# Patient Record
Sex: Female | Born: 1965 | Race: White | Hispanic: No | Marital: Married | Smoking: Never smoker
Health system: Southern US, Community
[De-identification: ages and names within clinical notes are randomized; demographics above are authoritative.]

## PROBLEM LIST (undated history)

## (undated) DIAGNOSIS — C801 Malignant (primary) neoplasm, unspecified: Secondary | ICD-10-CM

## (undated) DIAGNOSIS — Z923 Personal history of irradiation: Secondary | ICD-10-CM

## (undated) DIAGNOSIS — Z803 Family history of malignant neoplasm of breast: Secondary | ICD-10-CM

## (undated) HISTORY — DX: Family history of malignant neoplasm of breast: Z80.3

---

## 2017-06-14 ENCOUNTER — Other Ambulatory Visit: Payer: Self-pay

## 2017-06-14 DIAGNOSIS — R5381 Other malaise: Secondary | ICD-10-CM

## 2017-06-17 ENCOUNTER — Other Ambulatory Visit: Payer: Self-pay | Admitting: Nurse Practitioner

## 2017-06-17 DIAGNOSIS — R921 Mammographic calcification found on diagnostic imaging of breast: Secondary | ICD-10-CM

## 2017-06-29 ENCOUNTER — Other Ambulatory Visit: Payer: Self-pay | Admitting: Nurse Practitioner

## 2017-06-29 ENCOUNTER — Encounter (INDEPENDENT_AMBULATORY_CARE_PROVIDER_SITE_OTHER): Payer: Self-pay

## 2017-06-29 ENCOUNTER — Ambulatory Visit
Admission: RE | Admit: 2017-06-29 | Discharge: 2017-06-29 | Disposition: A | Discharge status: Home / Self Care | Payer: 59 | Source: Ambulatory Visit | Attending: Nurse Practitioner | Admitting: Nurse Practitioner

## 2017-06-29 DIAGNOSIS — R921 Mammographic calcification found on diagnostic imaging of breast: Secondary | ICD-10-CM

## 2018-01-07 ENCOUNTER — Ambulatory Visit
Admission: RE | Admit: 2018-01-07 | Discharge: 2018-01-07 | Disposition: A | Discharge status: Home / Self Care | Payer: BLUE CROSS/BLUE SHIELD | Source: Ambulatory Visit | Attending: Nurse Practitioner | Admitting: Nurse Practitioner

## 2018-01-07 DIAGNOSIS — R921 Mammographic calcification found on diagnostic imaging of breast: Secondary | ICD-10-CM

## 2018-11-30 ENCOUNTER — Other Ambulatory Visit: Payer: Self-pay | Admitting: Nurse Practitioner

## 2018-11-30 DIAGNOSIS — R921 Mammographic calcification found on diagnostic imaging of breast: Secondary | ICD-10-CM

## 2019-01-03 HISTORY — PX: BREAST BIOPSY: SHX20

## 2019-01-17 ENCOUNTER — Other Ambulatory Visit: Payer: Self-pay | Admitting: Nurse Practitioner

## 2019-01-17 ENCOUNTER — Other Ambulatory Visit: Payer: Self-pay

## 2019-01-17 ENCOUNTER — Ambulatory Visit
Admission: RE | Admit: 2019-01-17 | Discharge: 2019-01-17 | Disposition: A | Discharge status: Home / Self Care | Payer: 59 | Source: Ambulatory Visit | Attending: Nurse Practitioner | Admitting: Nurse Practitioner

## 2019-01-17 ENCOUNTER — Ambulatory Visit
Admission: RE | Admit: 2019-01-17 | Discharge: 2019-01-17 | Disposition: A | Discharge status: Home / Self Care | Payer: BC Managed Care – PPO | Source: Ambulatory Visit | Attending: Nurse Practitioner | Admitting: Nurse Practitioner

## 2019-01-17 DIAGNOSIS — N6310 Unspecified lump in the right breast, unspecified quadrant: Secondary | ICD-10-CM

## 2019-01-17 DIAGNOSIS — R921 Mammographic calcification found on diagnostic imaging of breast: Secondary | ICD-10-CM

## 2019-01-23 ENCOUNTER — Ambulatory Visit
Admission: RE | Admit: 2019-01-23 | Discharge: 2019-01-23 | Disposition: A | Discharge status: Home / Self Care | Payer: BC Managed Care – PPO | Source: Ambulatory Visit | Attending: Nurse Practitioner | Admitting: Nurse Practitioner

## 2019-01-23 ENCOUNTER — Other Ambulatory Visit: Payer: Self-pay

## 2019-01-23 DIAGNOSIS — R921 Mammographic calcification found on diagnostic imaging of breast: Secondary | ICD-10-CM

## 2019-01-23 DIAGNOSIS — N6310 Unspecified lump in the right breast, unspecified quadrant: Secondary | ICD-10-CM

## 2019-01-25 ENCOUNTER — Encounter: Payer: Self-pay | Admitting: *Deleted

## 2019-01-25 DIAGNOSIS — D0511 Intraductal carcinoma in situ of right breast: Secondary | ICD-10-CM | POA: Insufficient documentation

## 2019-01-30 NOTE — Progress Notes (Signed)
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 Ductal carcinoma in situ (DCIS) of right breast  D05.11     HISTORY OF PRESENT ILLNESS::Kari Roberts is a 53 y.o. female who is presenting to the office today for evaluation of her newly diagnosed breast cancer. She is accompanied by her husband. She is doing well overall.   Patient has a known history of right breast calcifications that were being followed since 12/29/2016. She underwent routine bilateral diagnostic mammography with tomography and right breast ultrasonography on 01/17/2019 at [facility], which showed two groups of changing right breast calcifications and a new right breast mass at 9 o'clock. No suspicious findings on the left.   Biopsy on 01/23/2019 showed intermediate to high grade ductal carcinoma in situ with calcifications and focal necrosis of both the lateral anterior and posterior aspects of the right breast. Right axillary lymph node was negative. Prognostic indicators significant for: estrogen receptor, 100% positive and progesterone receptor, 95% positive, both with strong staining intensity.  Menarche: 53 years old Age at first live birth: N/A GP: 0 LMP: 01/19/2019 Contraceptive: Yes, for 31 years HRT: No   The patient was referred today for presentation in the multidisciplinary conference.  Radiology studies and pathology slides were presented there for review and discussion of treatment options.  A consensus was discussed regarding potential next  steps.  PREVIOUS RADIATION THERAPY: No  PAST MEDICAL HISTORY: No past medical history on file.  PAST SURGICAL HISTORY:No past surgical history on file.  FAMILY HISTORY:  Family History  Problem Relation Age of Onset  . Lung cancer Father   . Breast cancer Sister     SOCIAL HISTORY:  Social History   Socioeconomic History  . Marital status: Unknown    Spouse name: Not on file  . Number of children: Not on file  . Years of education: Not on file  . Highest education level: Not on file  Occupational History  . Not on file  Tobacco Use  . Smoking status: Never Smoker  . Smokeless tobacco: Never Used  Substance and Sexual Activity  . Alcohol use: Not Currently  . Drug use: Never  . Sexual activity: Not on file  Other Topics Concern  . Not on file  Social History Narrative  . Not on file   Social Determinants of Health   Financial Resource Strain:   . Difficulty of Paying Living Expenses: Not on file  Food Insecurity:   . Worried About Charity fundraiser in the Last Year: Not on file  . Ran Out of Food in the Last Year: Not on file  Transportation Needs:   . Lack of Transportation (Medical): Not on file  . Lack of Transportation (Non-Medical): Not on file  Physical Activity:   . Days of Exercise per Week: Not on file  . Minutes of Exercise per Session: Not on file  Stress:   . Feeling of Stress : Not on file  Social Connections:   . Frequency of Communication with Friends and Family: Not on  file  . Frequency of Social Gatherings with Friends and Family: Not on file  . Attends Religious Services: Not on file  . Active Member of Clubs or Organizations: Not on file  . Attends Archivist Meetings: Not on file  . Marital Status: Not on file    ALLERGIES:  Allergies  Allergen Reactions  . Augmentin [Amoxicillin-Pot Clavulanate] Hives    MEDICATIONS:  No current outpatient medications on file.   No current facility-administered medications for this  encounter.    REVIEW OF SYSTEMS: A 10+ POINT REVIEW OF SYSTEMS WAS OBTAINED including neurology, dermatology, psychiatry, cardiac, respiratory, lymph, extremities, GI, GU, musculoskeletal, constitutional, reproductive, HEENT. On the provided form, she reports tinnitus and right breast pain. She reports wearing glasses. She denies fever, chills, shortness of breath, cough, abdominal pain, rash, and any other symptoms.    PHYSICAL EXAM:  Vitals with BMI 02/01/2019  Height '5\' 3"'$   Weight 137 lbs 10 oz  BMI 85.02  Systolic 774  Diastolic 86  Pulse 128    Lungs are clear to auscultation bilaterally. Heart has regular rate and rhythm. No palpable cervical, supraclavicular, or axillary adenopathy. Abdomen soft, non-tender, normal bowel sounds. Left breast with no palpable mass, nipple discharge, or bleeding. Right breast has two steri-strips in place on the lateral aspect of the breast with bruising. No palpable mass, nipple discharge, or bleeding.  KPS = 100  100 - Normal; no complaints; no evidence of disease. 90   - Able to carry on normal activity; minor signs or symptoms of disease. 80   - Normal activity with effort; some signs or symptoms of disease. 62   - Cares for self; unable to carry on normal activity or to do active work. 60   - Requires occasional assistance, but is able to care for most of his personal needs. 50   - Requires considerable assistance and frequent medical care. 74   - Disabled; requires special care and assistance. 15   - Severely disabled; hospital admission is indicated although death not imminent. 63   - Very sick; hospital admission necessary; active supportive treatment necessary. 10   - Moribund; fatal processes progressing rapidly. 0     - Dead  Karnofsky DA, Abelmann Guin, Craver LS and Burchenal [facility] [phone]) The use of the nitrogen mustards in the palliative treatment of carcinoma: with particular reference to bronchogenic carcinoma Cancer 1  [id number]  LABORATORY DATA:  Lab Results  Component Value Date   WBC 6.0 02/01/2019   HGB 14.5 02/01/2019   HCT 43.7 02/01/2019   MCV 87.2 02/01/2019   PLT 256 02/01/2019   Lab Results  Component Value Date   NA 140 02/01/2019   K 4.1 02/01/2019   CL 104 02/01/2019   CO2 24 02/01/2019   Lab Results  Component Value Date   ALT 20 02/01/2019   AST 19 02/01/2019   ALKPHOS 96 02/01/2019   BILITOT 0.3 02/01/2019    PULMONARY FUNCTION TEST:   Recent Review Flowsheet Data    There is no flowsheet data to display.      RADIOGRAPHY: US BREAST LTD UNI RIGHT INC AXILLA  Result Date: 01/17/2019 CLINICAL DATA:  Follow-up of right breast calcifications. EXAM: DIGITAL DIAGNOSTIC BILATERAL MAMMOGRAM WITH CAD AND TOMO ULTRASOUND RIGHT BREAST COMPARISON:  Previous exam(s). ACR Breast Density Category c: The breast tissue is heterogeneously dense, which may obscure small masses. FINDINGS: The 2 groups of right breast calcifications demonstrate interval change. The more anterior group demonstrates  linear components based on the CC view and span up to 6 mm. Just superior and posterior to this croup is another group of calcifications which are pleomorphic based on the 90 degree lateral view. This more posterior group spans 2.7 mm. Finally, there is a mass in the right breast located laterally measuring 6 mm. This mass persists on additional imaging. No other suspicious findings. Mammographic images were processed with CAD. On physical exam, no suspicious lumps are identified. Targeted ultrasound is performed, showing a hypoechoic mass in the right breast at 9 o'clock, 4 cm from the nipple measuring 4 x 2 x 7 mm, likely correlating with the mammographic finding. No axillary adenopathy. IMPRESSION: Two groups of changing right breast calcifications. New right breast mass at 9 o'clock. No suspicious findings on the left. RECOMMENDATION: Recommend stereotactic biopsy of both groups of calcifications. Recommend  ultrasound-guided biopsy of the right breast mass at 9 o'clock. I have discussed the findings and recommendations with the patient. If applicable, a reminder letter will be sent to the patient regarding the next appointment. BI-RADS CATEGORY  4: Suspicious. Electronically Signed   By: Dorise Bullion III M.D   On: 01/17/2019 12:14   MM DIAG BREAST TOMO BILATERAL  Result Date: 01/17/2019 CLINICAL DATA:  Follow-up of right breast calcifications. EXAM: DIGITAL DIAGNOSTIC BILATERAL MAMMOGRAM WITH CAD AND TOMO ULTRASOUND RIGHT BREAST COMPARISON:  Previous exam(s). ACR Breast Density Category c: The breast tissue is heterogeneously dense, which may obscure small masses. FINDINGS: The 2 groups of right breast calcifications demonstrate interval change. The more anterior group demonstrates linear components based on the CC view and span up to 6 mm. Just superior and posterior to this croup is another group of calcifications which are pleomorphic based on the 90 degree lateral view. This more posterior group spans 2.7 mm. Finally, there is a mass in the right breast located laterally measuring 6 mm. This mass persists on additional imaging. No other suspicious findings. Mammographic images were processed with CAD. On physical exam, no suspicious lumps are identified. Targeted ultrasound is performed, showing a hypoechoic mass in the right breast at 9 o'clock, 4 cm from the nipple measuring 4 x 2 x 7 mm, likely correlating with the mammographic finding. No axillary adenopathy. IMPRESSION: Two groups of changing right breast calcifications. New right breast mass at 9 o'clock. No suspicious findings on the left. RECOMMENDATION: Recommend stereotactic biopsy of both groups of calcifications. Recommend ultrasound-guided biopsy of the right breast mass at 9 o'clock. I have discussed the findings and recommendations with the patient. If applicable, a reminder letter will be sent to the patient regarding the next appointment.  BI-RADS CATEGORY  4: Suspicious. Electronically Signed   By: Dorise Bullion III M.D   On: 01/17/2019 12:14   MM CLIP PLACEMENT RIGHT  Result Date: 01/23/2019 CLINICAL DATA:  Patient status post stereotactic guided core needle biopsy right breast calcifications, 2 sites. Patient status post ultrasound-guided biopsy right breast mass. EXAM: DIAGNOSTIC RIGHT MAMMOGRAM POST ULTRASOUND AND STEREOTACTIC BIOPSY COMPARISON:  Previous exam(s). FINDINGS: Patient status post 3 biopsies of the right breast. Site 1: Right breast calcifications lateral anterior: Coil shaped: In appropriate position. Site 2: Right breast calcifications lateral posterior: X shaped clip: In appropriate position. Site 3: Right breast mass 9 o'clock position: Ribbon shaped clip: In appropriate position. IMPRESSION: Appropriate position of the biopsy marking clips after 3 site biopsy as described above. Final Assessment: Post Procedure Mammograms for Marker Placement Electronically Signed   By: Lovey Newcomer  M.D.   On: 01/23/2019 12:34   MM RT BREAST BX W LOC DEV 1ST LESION IMAGE BX SPEC STEREO GUIDE  Result Date: 01/23/2019 CLINICAL DATA:  Patient with indeterminate right breast calcifications lateral right breast, 2 sites. EXAM: RIGHT BREAST STEREOTACTIC CORE NEEDLE BIOPSY COMPARISON:  Previous exams. FINDINGS: The patient and I discussed the procedure of stereotactic-guided biopsy including benefits and alternatives. We discussed the high likelihood of a successful procedure. We discussed the risks of the procedure including infection, bleeding, tissue injury, clip migration, and inadequate sampling. Informed written consent was given. The usual time out protocol was performed immediately prior to the procedure. Site 1: Lateral right breast anterior: Coil shaped clip. Using sterile technique and 1% Lidocaine as local anesthetic, under stereotactic guidance, a 9 gauge vacuum assisted device was used to perform core needle biopsy of  calcifications within the outer right breast using a lateral approach. Specimen radiograph was performed showing calcifications. Specimens with calcifications are identified for pathology. Lesion quadrant: Upper outer quadrant At the conclusion of the procedure, coil shaped tissue marker clip was deployed into the biopsy cavity. Follow-up 2-view mammogram was performed and dictated separately. Site 2: Lateral right breast posterior: X shaped clip Using sterile technique and 1% Lidocaine as local anesthetic, under stereotactic guidance, a 9 gauge vacuum assisted device was used to perform core needle biopsy of calcifications within the outer right breast using a lateral approach. Specimen radiograph was performed showing calcifications. Specimens with calcifications are identified for pathology. Lesion quadrant: Upper outer quadrant At the conclusion of the procedure, X shaped tissue marker clip was deployed into the biopsy cavity. Follow-up 2-view mammogram was performed and dictated separately. IMPRESSION: Stereotactic-guided biopsy of right breast calcifications, 2 sites as above. No apparent complications. Electronically Signed   By: Lovey Newcomer M.D.   On: 01/23/2019 12:30   MM RT BREAST BX W LOC DEV EA AD LESION IMG BX SPEC STEREO GUIDE  Result Date: 01/23/2019 CLINICAL DATA:  Patient with indeterminate right breast calcifications lateral right breast, 2 sites. EXAM: RIGHT BREAST STEREOTACTIC CORE NEEDLE BIOPSY COMPARISON:  Previous exams. FINDINGS: The patient and I discussed the procedure of stereotactic-guided biopsy including benefits and alternatives. We discussed the high likelihood of a successful procedure. We discussed the risks of the procedure including infection, bleeding, tissue injury, clip migration, and inadequate sampling. Informed written consent was given. The usual time out protocol was performed immediately prior to the procedure. Site 1: Lateral right breast anterior: Coil shaped clip.  Using sterile technique and 1% Lidocaine as local anesthetic, under stereotactic guidance, a 9 gauge vacuum assisted device was used to perform core needle biopsy of calcifications within the outer right breast using a lateral approach. Specimen radiograph was performed showing calcifications. Specimens with calcifications are identified for pathology. Lesion quadrant: Upper outer quadrant At the conclusion of the procedure, coil shaped tissue marker clip was deployed into the biopsy cavity. Follow-up 2-view mammogram was performed and dictated separately. Site 2: Lateral right breast posterior: X shaped clip Using sterile technique and 1% Lidocaine as local anesthetic, under stereotactic guidance, a 9 gauge vacuum assisted device was used to perform core needle biopsy of calcifications within the outer right breast using a lateral approach. Specimen radiograph was performed showing calcifications. Specimens with calcifications are identified for pathology. Lesion quadrant: Upper outer quadrant At the conclusion of the procedure, X shaped tissue marker clip was deployed into the biopsy cavity. Follow-up 2-view mammogram was performed and dictated separately. IMPRESSION: Stereotactic-guided biopsy of right  breast calcifications, 2 sites as above. No apparent complications. Electronically Signed   By: Lovey Newcomer M.D.   On: 01/23/2019 12:30   [place] RT BREAST BX W LOC DEV 1ST LESION IMG BX SPEC US GUIDE  Addendum Date: 01/25/2019   ADDENDUM REPORT: 01/25/2019 15:47 ADDENDUM: Pathology revealed INTERMEDIATE TO HIGH GRADE DUCTAL CARCINOMA IN SITU WITH CALCIFICATIONS AND FOCAL NECROSIS of the RIGHT breast calcifications, lateral anterior. This was found to be concordant by Dr. Lovey Newcomer. Pathology revealed INTERMEDIATE TO HIGH GRADE DUCTAL CARCINOMA IN SITU WITH CALCIFICATIONS AND FOCAL NECROSIS of the RIGHT breast calcifications, lateral posterior. This was found to be concordant by Dr. Lovey Newcomer. Pathology revealed  BENIGN LYMPH NODE of the RIGHT breast, 9 o'clock. This was found to be concordant by Dr. Lovey Newcomer. Pathology results were discussed with the patient by telephone with Provider at [place]. The patient reported doing well after the biopsies with tenderness at the sites. Post biopsy instructions and care were reviewed and questions were answered. The patient was encouraged to call [facility] for any additional concerns. At the request of the patient, she was referred to [facility] on February 01, 2019. Pathology results reported by Stacie Acres, RN on 01/25/2019. Electronically Signed   By: Lovey Newcomer M.D.   On: 01/25/2019 15:47   Result Date: 01/25/2019 CLINICAL DATA:  Patient with indeterminate right breast mass 9 o'clock position. EXAM: ULTRASOUND GUIDED RIGHT BREAST CORE NEEDLE BIOPSY COMPARISON:  Previous exam(s). FINDINGS: I met with the patient and we discussed the procedure of ultrasound-guided biopsy, including benefits and alternatives. We discussed the high likelihood of a successful procedure. We discussed the risks of the procedure, including infection, bleeding, tissue injury, clip migration, and inadequate sampling. Informed written consent was given. The usual time-out protocol was performed immediately prior to the procedure. Lesion quadrant: Upper outer quadrant Using sterile technique and 1% Lidocaine as local anesthetic, under direct ultrasound visualization, a 14 gauge spring-loaded device was used to perform biopsy of right breast mass 9 o'clock position using a lateral approach. At the conclusion of the procedure ribbon shaped tissue marker clip was deployed into the biopsy cavity. Follow up 2 view mammogram was performed and dictated separately. IMPRESSION: Ultrasound guided biopsy of right breast mass 9 o'clock position. No apparent complications. Electronically Signed:  By: Lovey Newcomer M.D. On: 01/23/2019 12:25      IMPRESSION: Stage 0, Right Breast UOQ, Ductal Carcinoma DCIS, ER+ / PR+, Grade 2-3   stage 0 right breast cancer.  patient is an excellent candidate for breast conservation with radiation therapy directed at the right breast.  With her young age I would not recommend lumpectomy alone in this situation. she also appears to be a good candidate for hypofractionated accelerated radiation therapy over approximately four weeks. We discussed the general course of radiation, potential side effects, and toxicities with radiation and the patient is interested in this approach. We discussed potential radiation therapy at a facility closer to her home in [place], [place]. However, she states that she is more comfortable with treatment here.   PLAN:  1. Lumpectomy 2. Adjuvant radiation therapy 3. Aromatase inhibitor   ------------------------------------------------  Blair Promise, PhD, MD  This document serves as a record of services personally performed by Gery Pray, MD. It was created on his behalf by Clerance Lav, a trained medical scribe. The creation of this record is based on  the scribe's personal observations and the provider's statements to them. This document has been checked and approved by the attending provider.

## 2019-01-31 NOTE — Progress Notes (Signed)
[place] NOTE  Patient Care Team: Renne Crigler, NP as PCP - General (Nurse Practitioner) Mauro Kaufmann, RN as Oncology Nurse Navigator Rockwell Germany, RN as Oncology Nurse Navigator Rolm Bookbinder, MD as Consulting Physician (General Surgery) Nicholas Lose, MD as Consulting Physician (Hematology and Oncology) Gery Pray, MD as Consulting Physician (Radiation Oncology)  CHIEF COMPLAINTS/PURPOSE OF CONSULTATION:  Newly diagnosed breast cancer  HISTORY OF PRESENTING ILLNESS:  Kari Roberts 53 y.o. female is here because of recent diagnosis of ductal carcinoma in situ of the right breast. Diagnostic mammogram on 01/17/19 performed as follow-up of probably benign right breast calcifications showed two groups of right breast calcifications measuring 0.6cm and 0.3cm and a right breast mass measuring 0.7cm, with no axillary adenopathy. Biopsy on 01/23/19 showed DCIS with calcifications and focal necrosis at the lateral anterior position and lateral posterior position, high grade, ER+ 100%, PR+ 95%. And a benign lymph node representing the mass at the 9 o'clock position. She presents to the clinic today for initial evaluation and discussion of treatment options.   I reviewed her records extensively and collaborated the history with the patient.  SUMMARY OF ONCOLOGIC HISTORY: Oncology History  Ductal carcinoma in situ (DCIS) of right breast  01/25/2019 Initial Diagnosis   Patient had history of probably benign right breast calcifications. Mammogram showed two groups of right breast calcifications measuring 0.6cm and 0.3cm and a right breast mass measuring 0.7cm, with no axillary adenopathy. Biopsy on 01/23/19 showed DCIS with calcifications and focal necrosis at the lateral anterior position and lateral posterior position, high grade, ER+ 100%, PR+ 95%. And a benign lymph node representing the mass at the 9 o'clock position.     MEDICAL HISTORY:  No past  medical problems SURGICAL HISTORY: No prior surgeries SOCIAL HISTORY: Social History   Socioeconomic History  . Marital status: Unknown    Spouse name: Not on file  . Number of children: Not on file  . Years of education: Not on file  . Highest education level: Not on file  Occupational History  . Not on file  Tobacco Use  . Smoking status: Never Smoker  . Smokeless tobacco: Never Used  Substance and Sexual Activity  . Alcohol use: Not Currently  . Drug use: Never  . Sexual activity: Not on file  Other Topics Concern  . Not on file  Social History Narrative  . Not on file   Social Determinants of Health   Financial Resource Strain:   . Difficulty of Paying Living Expenses: Not on file  Food Insecurity:   . Worried About Charity fundraiser in the Last Year: Not on file  . Ran Out of Food in the Last Year: Not on file  Transportation Needs:   . Lack of Transportation (Medical): Not on file  . Lack of Transportation (Non-Medical): Not on file  Physical Activity:   . Days of Exercise per Week: Not on file  . Minutes of Exercise per Session: Not on file  Stress:   . Feeling of Stress : Not on file  Social Connections:   . Frequency of Communication with Friends and Family: Not on file  . Frequency of Social Gatherings with Friends and Family: Not on file  . Attends Religious Services: Not on file  . Active Member of Clubs or Organizations: Not on file  . Attends Archivist Meetings: Not on file  . Marital Status: Not on file  Intimate Partner Violence:   .  Fear of Current or Ex-Partner: Not on file  . Emotionally Abused: Not on file  . Physically Abused: Not on file  . Sexually Abused: Not on file    FAMILY HISTORY: Family History  Problem Relation Age of Onset  . Lung cancer Father   . Breast cancer Sister     ALLERGIES:  is allergic to augmentin [amoxicillin-pot clavulanate].  MEDICATIONS:  No current outpatient medications on file.   No  current facility-administered medications for this visit.    REVIEW OF SYSTEMS:   Constitutional: Denies fevers, chills or abnormal night sweats Eyes: Denies blurriness of vision, double vision or watery eyes Ears, nose, mouth, throat, and face: Denies mucositis or sore throat Respiratory: Denies cough, dyspnea or wheezes Cardiovascular: Denies palpitation, chest discomfort or lower extremity swelling Gastrointestinal:  Denies nausea, heartburn or change in bowel habits Skin: Denies abnormal skin rashes Lymphatics: Denies new lymphadenopathy or easy bruising Neurological:Denies numbness, tingling or new weaknesses Behavioral/Psych: Mood is stable, no new changes  Breast: Denies any palpable lumps or discharge All other systems were reviewed with the patient and are negative.  PHYSICAL EXAMINATION: ECOG PERFORMANCE STATUS: 0 - Asymptomatic  Vitals:   02/01/19 0912  BP: 138/86  Pulse: (!) 113  Resp: 19  Temp: 98 F (36.7 C)  SpO2: 97%   Filed Weights   02/01/19 0912  Weight: 137 lb 9.6 oz (62.4 kg)    GENERAL:alert, no distress and comfortable SKIN: skin color, texture, turgor are normal, no rashes or significant lesions EYES: normal, conjunctiva are pink and non-injected, sclera clear OROPHARYNX:no exudate, no erythema and lips, buccal mucosa, and tongue normal  NECK: supple, thyroid normal size, non-tender, without nodularity LYMPH:  no palpable lymphadenopathy in the cervical, axillary or inguinal LUNGS: clear to auscultation and percussion with normal breathing effort HEART: regular rate & rhythm and no murmurs and no lower extremity edema ABDOMEN:abdomen soft, non-tender and normal bowel sounds Musculoskeletal:no cyanosis of digits and no clubbing  PSYCH: alert & oriented x 3 with fluent speech NEURO: no focal motor/sensory deficits BREAST: No palpable nodules in breast. No palpable axillary or supraclavicular lymphadenopathy (exam performed in the presence of a  chaperone)   LABORATORY DATA:  I have reviewed the data as listed Lab Results  Component Value Date   WBC 6.0 02/01/2019   HGB 14.5 02/01/2019   HCT 43.7 02/01/2019   MCV 87.2 02/01/2019   PLT 256 02/01/2019   Lab Results  Component Value Date   NA 140 02/01/2019   K 4.1 02/01/2019   CL 104 02/01/2019   CO2 24 02/01/2019    RADIOGRAPHIC STUDIES: I have personally reviewed the radiological reports and agreed with the findings in the report.  ASSESSMENT AND PLAN:  Ductal carcinoma in situ (DCIS) of right breast 01/25/2019:Patient had history of probably benign right breast calcifications. Mammogram showed two groups of right breast calcifications measuring 0.6cm and 0.3cm and a right breast mass measuring 0.7cm, with no axillary adenopathy. Biopsy on 01/23/19 showed DCIS with calcifications and focal necrosis, high grade, ER+ 100%, PR+ 95%.  Biopsy-proven benign lymph node representing the mass at the 9 o'clock position.  Pathology review: I discussed with the patient the difference between DCIS and invasive breast cancer. It is considered a precancerous lesion. DCIS is classified as a 0. It is generally detected through mammograms as calcifications. We discussed the significance of grades and its impact on prognosis. We also discussed the importance of ER and PR receptors and their implications to adjuvant  treatment options. Prognosis of DCIS dependence on grade, comedo necrosis. It is anticipated that if not treated, 20-30% of DCIS can develop into invasive breast cancer.  Recommendation: 1. Breast conserving surgery 2. Followed by adjuvant radiation therapy 3. Followed by antiestrogen therapy with tamoxifen 5 years  Tamoxifen counseling: We discussed the risks and benefits of tamoxifen. These include but not limited to insomnia, hot flashes, mood changes, vaginal dryness, and weight gain. Although rare, serious side effects including endometrial cancer, risk of blood clots were  also discussed. We strongly believe that the benefits far outweigh the risks. Patient understands these risks and consented to starting treatment. Planned treatment duration is 5 years.  Return to clinic after surgery to discuss the final pathology report and come up with an adjuvant treatment plan.  All questions were answered. The patient knows to call the clinic with any problems, questions or concerns.   Rulon Eisenmenger, MD, MPH 02/01/2019    I, Molly Dorshimer, am acting as scribe for Nicholas Lose, MD.  I have reviewed the above documentation for accuracy and completeness, and I agree with the above.

## 2019-02-01 ENCOUNTER — Other Ambulatory Visit: Payer: Self-pay

## 2019-02-01 ENCOUNTER — Encounter: Payer: Self-pay | Admitting: Hematology and Oncology

## 2019-02-01 ENCOUNTER — Other Ambulatory Visit: Payer: Self-pay | Admitting: General Surgery

## 2019-02-01 ENCOUNTER — Ambulatory Visit
Admission: RE | Admit: 2019-02-01 | Discharge: 2019-02-01 | Disposition: A | Discharge status: Home / Self Care | Payer: BC Managed Care – PPO | Source: Ambulatory Visit | Attending: Radiation Oncology | Admitting: Radiation Oncology

## 2019-02-01 ENCOUNTER — Inpatient Hospital Stay: Payer: BC Managed Care – PPO | Attending: Hematology and Oncology | Admitting: Hematology and Oncology

## 2019-02-01 ENCOUNTER — Inpatient Hospital Stay: Payer: BC Managed Care – PPO

## 2019-02-01 DIAGNOSIS — D0511 Intraductal carcinoma in situ of right breast: Secondary | ICD-10-CM

## 2019-02-01 DIAGNOSIS — Z170 Estrogen receptor positive status [ER+]: Secondary | ICD-10-CM | POA: Diagnosis not present

## 2019-02-01 DIAGNOSIS — Z803 Family history of malignant neoplasm of breast: Secondary | ICD-10-CM | POA: Insufficient documentation

## 2019-02-01 DIAGNOSIS — Z801 Family history of malignant neoplasm of trachea, bronchus and lung: Secondary | ICD-10-CM | POA: Insufficient documentation

## 2019-02-01 LAB — CBC WITH DIFFERENTIAL (CANCER CENTER ONLY)
Abs Immature Granulocytes: 0.02 10*3/uL (ref 0.00–0.07)
Basophils Absolute: 0 10*3/uL (ref 0.0–0.1)
Basophils Relative: 0 %
Eosinophils Absolute: 0 10*3/uL (ref 0.0–0.5)
Eosinophils Relative: 1 %
HCT: 43.7 % (ref 36.0–46.0)
Hemoglobin: 14.5 g/dL (ref 12.0–15.0)
Immature Granulocytes: 0 %
Lymphocytes Relative: 26 %
Lymphs Abs: 1.6 10*3/uL (ref 0.7–4.0)
MCH: 28.9 pg (ref 26.0–34.0)
MCHC: 33.2 g/dL (ref 30.0–36.0)
MCV: 87.2 fL (ref 80.0–100.0)
Monocytes Absolute: 0.5 10*3/uL (ref 0.1–1.0)
Monocytes Relative: 8 %
Neutro Abs: 3.9 10*3/uL (ref 1.7–7.7)
Neutrophils Relative %: 65 %
Platelet Count: 256 10*3/uL (ref 150–400)
RBC: 5.01 MIL/uL (ref 3.87–5.11)
RDW: 12.3 % (ref 11.5–15.5)
WBC Count: 6 10*3/uL (ref 4.0–10.5)
nRBC: 0 % (ref 0.0–0.2)

## 2019-02-01 LAB — CMP (CANCER CENTER ONLY)
ALT: 20 U/L (ref 0–44)
AST: 19 U/L (ref 15–41)
Albumin: 4.1 g/dL (ref 3.5–5.0)
Alkaline Phosphatase: 96 U/L (ref 38–126)
Anion gap: 12 (ref 5–15)
BUN: 7 mg/dL (ref 6–20)
CO2: 24 mmol/L (ref 22–32)
Calcium: 9.2 mg/dL (ref 8.9–10.3)
Chloride: 104 mmol/L (ref 98–111)
Creatinine: 0.75 mg/dL (ref 0.44–1.00)
GFR, Est AFR Am: >60 mL/min (ref >60)
GFR, Estimated: >60 mL/min (ref >60)
Glucose, Bld: 129 mg/dL — ABNORMAL HIGH (ref 70–99)
Potassium: 4.1 mmol/L (ref 3.5–5.1)
Sodium: 140 mmol/L (ref 135–145)
Total Bilirubin: 0.3 mg/dL (ref 0.3–1.2)
Total Protein: 7.7 g/dL (ref 6.5–8.1)

## 2019-02-01 LAB — GENETIC SCREENING ORDER

## 2019-02-01 NOTE — Assessment & Plan Note (Signed)
01/25/2019:Patient had history of probably benign right breast calcifications. Mammogram showed two groups of right breast calcifications measuring 0.6cm and 0.3cm and a right breast mass measuring 0.7cm, with no axillary adenopathy. Biopsy on 01/23/19 showed DCIS with calcifications and focal necrosis, high grade, ER+ 100%, PR+ 95%.  Biopsy-proven benign lymph node representing the mass at the 9 o'clock position.  Pathology review: I discussed with the patient the difference between DCIS and invasive breast cancer. It is considered a precancerous lesion. DCIS is classified as a 0. It is generally detected through mammograms as calcifications. We discussed the significance of grades and its impact on prognosis. We also discussed the importance of ER and PR receptors and their implications to adjuvant treatment options. Prognosis of DCIS dependence on grade, comedo necrosis. It is anticipated that if not treated, 20-30% of DCIS can develop into invasive breast cancer.  Recommendation: 1. Breast conserving surgery 2. Followed by adjuvant radiation therapy 3. Followed by antiestrogen therapy with tamoxifen 5 years  Tamoxifen counseling: We discussed the risks and benefits of tamoxifen. These include but not limited to insomnia, hot flashes, mood changes, vaginal dryness, and weight gain. Although rare, serious side effects including endometrial cancer, risk of blood clots were also discussed. We strongly believe that the benefits far outweigh the risks. Patient understands these risks and consented to starting treatment. Planned treatment duration is 5 years.  Return to clinic after surgery to discuss the final pathology report and come up with an adjuvant treatment plan.

## 2019-02-02 ENCOUNTER — Other Ambulatory Visit: Payer: Self-pay | Admitting: *Deleted

## 2019-02-02 ENCOUNTER — Other Ambulatory Visit: Payer: Self-pay | Admitting: General Surgery

## 2019-02-02 DIAGNOSIS — D0511 Intraductal carcinoma in situ of right breast: Secondary | ICD-10-CM

## 2019-02-03 HISTORY — PX: BREAST LUMPECTOMY: SHX2

## 2019-02-07 ENCOUNTER — Telehealth: Payer: Self-pay | Admitting: Hematology and Oncology

## 2019-02-07 NOTE — Telephone Encounter (Signed)
Scheduled per 12/31 scheduled message, patient has been called and notified.

## 2019-02-08 ENCOUNTER — Other Ambulatory Visit: Payer: Self-pay

## 2019-02-08 ENCOUNTER — Encounter (HOSPITAL_BASED_OUTPATIENT_CLINIC_OR_DEPARTMENT_OTHER): Payer: Self-pay | Admitting: General Surgery

## 2019-02-08 ENCOUNTER — Telehealth: Payer: Self-pay

## 2019-02-08 NOTE — Telephone Encounter (Signed)
Nutrition Assessment  Reason for Assessment:  Pt attended Breast Clinic on 02/01/2019 and was given nutrition packet by nurse navigator  ASSESSMENT:  54 year old female with new diagnosis of breast cancer.  Planning lumpectomy on 1/12 followed by adjuvant radiation, and tamoxifen.    Spoke with patient via phone to introduce self and service at [facility].    Medications:  reviewed  Labs: reviewed  Anthropometrics:   Height: 63 inches Weight: 137 lb BMI: 24   NUTRITION DIAGNOSIS: Food and nutrition related knowledge deficit related to new diagnosis of breast cancer as evidenced by no prior need for nutrition related information.  INTERVENTION:   Discussed briefly packet of information regarding nutritional tips for breast cancer patients.  Questions answered.  Contact information provided and patient knows to contact me with questions/concerns.    MONITORING, EVALUATION, and GOAL: Pt will consume a healthy plant based diet to maintain lean body mass throughout treatment.   Torina Ey B. Zenia Resides, [facility], [place] Registered Dietitian [phone] (pager)

## 2019-02-09 ENCOUNTER — Encounter: Payer: Self-pay | Admitting: *Deleted

## 2019-02-10 ENCOUNTER — Other Ambulatory Visit (HOSPITAL_COMMUNITY)
Admission: RE | Admit: 2019-02-10 | Discharge: 2019-02-10 | Disposition: A | Discharge status: Home / Self Care | Payer: BC Managed Care – PPO | Source: Ambulatory Visit | Attending: General Surgery | Admitting: General Surgery

## 2019-02-10 ENCOUNTER — Encounter (HOSPITAL_BASED_OUTPATIENT_CLINIC_OR_DEPARTMENT_OTHER)
Admission: RE | Admit: 2019-02-10 | Discharge: 2019-02-10 | Disposition: A | Discharge status: Home / Self Care | Payer: BC Managed Care – PPO | Source: Ambulatory Visit | Attending: General Surgery | Admitting: General Surgery

## 2019-02-10 ENCOUNTER — Other Ambulatory Visit: Payer: Self-pay

## 2019-02-10 DIAGNOSIS — Z01812 Encounter for preprocedural laboratory examination: Secondary | ICD-10-CM | POA: Diagnosis present

## 2019-02-10 DIAGNOSIS — Z20822 Contact with and (suspected) exposure to covid-19: Secondary | ICD-10-CM | POA: Diagnosis not present

## 2019-02-10 LAB — SARS CORONAVIRUS 2 (TAT 6-24 HRS): SARS Coronavirus 2: NEGATIVE

## 2019-02-10 LAB — POCT PREGNANCY, URINE: Preg Test, Ur: NEGATIVE

## 2019-02-10 MED ORDER — ENSURE PRE-SURGERY PO LIQD: 296.0000 mL | Freq: Once | ORAL | Status: DC

## 2019-02-10 NOTE — Progress Notes (Signed)
      Enhanced Recovery after Surgery for Orthopedics Enhanced Recovery after Surgery is a protocol used to improve the stress on your body and your recovery after surgery.  Patient Instructions  . The night before surgery:  o No food after midnight. ONLY clear liquids after midnight  . The day of surgery (if you do NOT have diabetes):  o Drink ONE (1) Pre-Surgery Clear Ensure as directed.   o This drink was given to you during your hospital  pre-op appointment visit. o The pre-op nurse will instruct you on the time to drink the  Pre-Surgery Ensure depending on your surgery time. o Finish the drink at the designated time by the pre-op nurse.  o Nothing else to drink after completing the  Pre-Surgery Clear Ensure.  . The day of surgery (if you have diabetes): o Drink ONE (1) Gatorade 2 (G2) as directed. o This drink was given to you during your hospital  pre-op appointment visit.  o The pre-op nurse will instruct you on the time to drink the   Gatorade 2 (G2) depending on your surgery time. o Color of the Gatorade may vary. Red is not allowed. o Nothing else to drink after completing the  Gatorade 2 (G2).         If you have questions, please contact your surgeon's office. Marland Kitchensoap

## 2019-02-13 ENCOUNTER — Other Ambulatory Visit: Payer: Self-pay

## 2019-02-13 ENCOUNTER — Ambulatory Visit
Admission: RE | Admit: 2019-02-13 | Discharge: 2019-02-13 | Disposition: A | Discharge status: Home / Self Care | Payer: BC Managed Care – PPO | Source: Ambulatory Visit | Attending: General Surgery | Admitting: General Surgery

## 2019-02-13 DIAGNOSIS — D0511 Intraductal carcinoma in situ of right breast: Secondary | ICD-10-CM

## 2019-02-14 ENCOUNTER — Ambulatory Visit (HOSPITAL_BASED_OUTPATIENT_CLINIC_OR_DEPARTMENT_OTHER)
Admission: RE | Admit: 2019-02-14 | Discharge: 2019-02-14 | Disposition: A | Discharge status: Home / Self Care | Payer: BC Managed Care – PPO | Attending: General Surgery | Admitting: General Surgery

## 2019-02-14 ENCOUNTER — Encounter (HOSPITAL_BASED_OUTPATIENT_CLINIC_OR_DEPARTMENT_OTHER)
Admission: RE | Disposition: A | Discharge status: Home / Self Care | Payer: Self-pay | Source: Home / Self Care | Attending: General Surgery

## 2019-02-14 ENCOUNTER — Encounter (HOSPITAL_BASED_OUTPATIENT_CLINIC_OR_DEPARTMENT_OTHER): Payer: Self-pay | Admitting: General Surgery

## 2019-02-14 ENCOUNTER — Other Ambulatory Visit: Payer: Self-pay

## 2019-02-14 ENCOUNTER — Ambulatory Visit
Admission: RE | Admit: 2019-02-14 | Discharge: 2019-02-14 | Disposition: A | Discharge status: Home / Self Care | Payer: BC Managed Care – PPO | Source: Ambulatory Visit | Attending: General Surgery | Admitting: General Surgery

## 2019-02-14 ENCOUNTER — Ambulatory Visit (HOSPITAL_BASED_OUTPATIENT_CLINIC_OR_DEPARTMENT_OTHER): Payer: BC Managed Care – PPO | Admitting: Anesthesiology

## 2019-02-14 DIAGNOSIS — D0511 Intraductal carcinoma in situ of right breast: Secondary | ICD-10-CM | POA: Insufficient documentation

## 2019-02-14 DIAGNOSIS — Z170 Estrogen receptor positive status [ER+]: Secondary | ICD-10-CM | POA: Insufficient documentation

## 2019-02-14 HISTORY — DX: Malignant (primary) neoplasm, unspecified: C80.1

## 2019-02-14 HISTORY — PX: BREAST LUMPECTOMY WITH RADIOACTIVE SEED LOCALIZATION: SHX6424

## 2019-02-14 SURGERY — BREAST LUMPECTOMY WITH RADIOACTIVE SEED LOCALIZATION
Anesthesia: General | Site: Breast | Laterality: Right

## 2019-02-14 MED ORDER — CIPROFLOXACIN IN D5W 400 MG/200ML IV SOLN
INTRAVENOUS | Status: DC | PRN
  Administered 2019-02-14: 400 mg via INTRAVENOUS

## 2019-02-14 MED ORDER — PROPOFOL 10 MG/ML IV BOLUS
INTRAVENOUS | Status: DC | PRN
  Administered 2019-02-14: 200 mg via INTRAVENOUS

## 2019-02-14 MED ORDER — OXYCODONE HCL 5 MG/5ML PO SOLN: 5.0000 mg | Freq: Once | ORAL | Status: DC | PRN

## 2019-02-14 MED ORDER — OXYCODONE HCL 5 MG PO TABS: 5.0000 mg | ORAL_TABLET | Freq: Once | ORAL | Status: DC | PRN

## 2019-02-14 MED ORDER — FENTANYL CITRATE (PF) 100 MCG/2ML IJ SOLN
INTRAMUSCULAR | Status: DC | PRN
  Administered 2019-02-14: 25 ug via INTRAVENOUS

## 2019-02-14 MED ORDER — MIDAZOLAM HCL 2 MG/2ML IJ SOLN: 1.0000 mg | INTRAMUSCULAR | Status: DC | PRN

## 2019-02-14 MED ORDER — KETOROLAC TROMETHAMINE 15 MG/ML IJ SOLN
15.0000 mg | INTRAMUSCULAR | Status: AC
  Administered 2019-02-14: 15 mg via INTRAVENOUS

## 2019-02-14 MED ORDER — ACETAMINOPHEN 160 MG/5ML PO SOLN: 325.0000 mg | ORAL | Status: DC | PRN

## 2019-02-14 MED ORDER — ONDANSETRON HCL 4 MG/2ML IJ SOLN: 4.0000 mg | Freq: Once | INTRAMUSCULAR | Status: DC | PRN

## 2019-02-14 MED ORDER — MEPERIDINE HCL 25 MG/ML IJ SOLN: 6.2500 mg | INTRAMUSCULAR | Status: DC | PRN

## 2019-02-14 MED ORDER — FENTANYL CITRATE (PF) 100 MCG/2ML IJ SOLN
INTRAMUSCULAR | Status: AC
  Filled 2019-02-14: qty 2

## 2019-02-14 MED ORDER — ACETAMINOPHEN 500 MG PO TABS
1000.0000 mg | ORAL_TABLET | ORAL | Status: AC
  Administered 2019-02-14: 08:00:00 1000 mg via ORAL

## 2019-02-14 MED ORDER — CIPROFLOXACIN IN D5W 400 MG/200ML IV SOLN
INTRAVENOUS | Status: AC
  Filled 2019-02-14: qty 200

## 2019-02-14 MED ORDER — PROPOFOL 10 MG/ML IV BOLUS
INTRAVENOUS | Status: AC
  Filled 2019-02-14: qty 20

## 2019-02-14 MED ORDER — LIDOCAINE 2% (20 MG/ML) 5 ML SYRINGE
INTRAMUSCULAR | Status: AC
  Filled 2019-02-14: qty 10

## 2019-02-14 MED ORDER — KETOROLAC TROMETHAMINE 15 MG/ML IJ SOLN
INTRAMUSCULAR | Status: AC
  Filled 2019-02-14: qty 1

## 2019-02-14 MED ORDER — FENTANYL CITRATE (PF) 100 MCG/2ML IJ SOLN: 25.0000 ug | INTRAMUSCULAR | Status: DC | PRN

## 2019-02-14 MED ORDER — PHENYLEPHRINE 40 MCG/ML (10ML) SYRINGE FOR IV PUSH (FOR BLOOD PRESSURE SUPPORT)
PREFILLED_SYRINGE | INTRAVENOUS | Status: AC
  Filled 2019-02-14: qty 10

## 2019-02-14 MED ORDER — GABAPENTIN 100 MG PO CAPS
100.0000 mg | ORAL_CAPSULE | ORAL | Status: AC
  Administered 2019-02-14: 08:00:00 100 mg via ORAL

## 2019-02-14 MED ORDER — SCOPOLAMINE 1 MG/3DAYS TD PT72
MEDICATED_PATCH | TRANSDERMAL | Status: DC | PRN
  Administered 2019-02-14: 1 via TRANSDERMAL

## 2019-02-14 MED ORDER — BUPIVACAINE HCL (PF) 0.25 % IJ SOLN
INTRAMUSCULAR | Status: DC | PRN
  Administered 2019-02-14: 10 mL

## 2019-02-14 MED ORDER — OXYCODONE HCL 5 MG PO TABS: 5.0000 mg | ORAL_TABLET | Freq: Four times a day (QID) | ORAL | 0 refills | Status: DC | PRN

## 2019-02-14 MED ORDER — DEXAMETHASONE SODIUM PHOSPHATE 10 MG/ML IJ SOLN
INTRAMUSCULAR | Status: AC
  Filled 2019-02-14: qty 1

## 2019-02-14 MED ORDER — DEXAMETHASONE SODIUM PHOSPHATE 10 MG/ML IJ SOLN
INTRAMUSCULAR | Status: DC | PRN
  Administered 2019-02-14: 10 mg via INTRAVENOUS

## 2019-02-14 MED ORDER — ONDANSETRON HCL 4 MG/2ML IJ SOLN
INTRAMUSCULAR | Status: DC | PRN
  Administered 2019-02-14: 4 mg via INTRAVENOUS

## 2019-02-14 MED ORDER — CIPROFLOXACIN IN D5W 400 MG/200ML IV SOLN
400.0000 mg | INTRAVENOUS | Status: AC
  Administered 2019-02-14: 08:00:00 400 mg via INTRAVENOUS

## 2019-02-14 MED ORDER — ACETAMINOPHEN 500 MG PO TABS
ORAL_TABLET | ORAL | Status: AC
  Filled 2019-02-14: qty 2

## 2019-02-14 MED ORDER — LACTATED RINGERS IV SOLN: INTRAVENOUS | Status: DC

## 2019-02-14 MED ORDER — GABAPENTIN 100 MG PO CAPS
ORAL_CAPSULE | ORAL | Status: AC
  Filled 2019-02-14: qty 1

## 2019-02-14 MED ORDER — ONDANSETRON HCL 4 MG/2ML IJ SOLN
INTRAMUSCULAR | Status: AC
  Filled 2019-02-14: qty 2

## 2019-02-14 MED ORDER — FENTANYL CITRATE (PF) 100 MCG/2ML IJ SOLN: 50.0000 ug | INTRAMUSCULAR | Status: DC | PRN

## 2019-02-14 MED ORDER — LIDOCAINE 2% (20 MG/ML) 5 ML SYRINGE
INTRAMUSCULAR | Status: DC | PRN
  Administered 2019-02-14: 80 mg via INTRAVENOUS

## 2019-02-14 MED ORDER — ACETAMINOPHEN 325 MG PO TABS: 325.0000 mg | ORAL_TABLET | ORAL | Status: DC | PRN

## 2019-02-14 SURGICAL SUPPLY — 57 items
APPLIER CLIP 9.375 MED OPEN (MISCELLANEOUS)
BINDER BREAST LRG (GAUZE/BANDAGES/DRESSINGS) ×3 IMPLANT
BINDER BREAST XLRG (GAUZE/BANDAGES/DRESSINGS) IMPLANT
BLADE SURG 15 STRL LF DISP TIS (BLADE) ×1 IMPLANT
BLADE SURG 15 STRL SS (BLADE) ×2
CANISTER SUC SOCK COL 7IN (MISCELLANEOUS) IMPLANT
CANISTER SUCT 1200ML W/VALVE (MISCELLANEOUS) IMPLANT
CHLORAPREP W/TINT 26 (MISCELLANEOUS) ×3 IMPLANT
CLIP APPLIE 9.375 MED OPEN (MISCELLANEOUS) IMPLANT
CLIP VESOCCLUDE SM WIDE 6/CT (CLIP) ×3 IMPLANT
CLOSURE WOUND 1/2 X4 (GAUZE/BANDAGES/DRESSINGS) ×1
COVER BACK TABLE 60X90IN (DRAPES) ×3 IMPLANT
COVER MAYO STAND STRL (DRAPES) ×3 IMPLANT
COVER PROBE W GEL 5X96 (DRAPES) ×3 IMPLANT
DECANTER SPIKE VIAL GLASS SM (MISCELLANEOUS) IMPLANT
DERMABOND ADVANCED (GAUZE/BANDAGES/DRESSINGS) ×2
DERMABOND ADVANCED .7 DNX12 (GAUZE/BANDAGES/DRESSINGS) ×1 IMPLANT
DRAPE LAPAROSCOPIC ABDOMINAL (DRAPES) ×3 IMPLANT
DRAPE UTILITY XL STRL (DRAPES) ×3 IMPLANT
DRSG TEGADERM 4X4.75 (GAUZE/BANDAGES/DRESSINGS) IMPLANT
ELECT COATED BLADE 2.86 ST (ELECTRODE) ×3 IMPLANT
ELECT REM PT RETURN 9FT ADLT (ELECTROSURGICAL) ×3
ELECTRODE REM PT RTRN 9FT ADLT (ELECTROSURGICAL) ×1 IMPLANT
GAUZE SPONGE 4X4 12PLY STRL LF (GAUZE/BANDAGES/DRESSINGS) IMPLANT
GLOVE BIO SURGEON STRL SZ 6.5 (GLOVE) ×2 IMPLANT
GLOVE BIO SURGEON STRL SZ7 (GLOVE) ×6 IMPLANT
GLOVE BIO SURGEONS STRL SZ 6.5 (GLOVE) ×1
GLOVE BIOGEL PI IND STRL 7.0 (GLOVE) ×1 IMPLANT
GLOVE BIOGEL PI IND STRL 7.5 (GLOVE) ×1 IMPLANT
GLOVE BIOGEL PI INDICATOR 7.0 (GLOVE) ×2
GLOVE BIOGEL PI INDICATOR 7.5 (GLOVE) ×2
GOWN STRL REUS W/ TWL LRG LVL3 (GOWN DISPOSABLE) ×2 IMPLANT
GOWN STRL REUS W/TWL LRG LVL3 (GOWN DISPOSABLE) ×4
HEMOSTAT ARISTA ABSORB 3G PWDR (HEMOSTASIS) IMPLANT
KIT MARKER MARGIN INK (KITS) ×3 IMPLANT
NEEDLE HYPO 25X1 1.5 SAFETY (NEEDLE) ×3 IMPLANT
NS IRRIG 1000ML POUR BTL (IV SOLUTION) IMPLANT
PACK BASIN DAY SURGERY FS (CUSTOM PROCEDURE TRAY) ×3 IMPLANT
PENCIL SMOKE EVACUATOR (MISCELLANEOUS) ×3 IMPLANT
RETRACTOR ONETRAX LX 90X20 (MISCELLANEOUS) ×3 IMPLANT
SLEEVE SCD COMPRESS KNEE MED (MISCELLANEOUS) ×3 IMPLANT
SPONGE LAP 4X18 RFD (DISPOSABLE) ×3 IMPLANT
STRIP CLOSURE SKIN 1/2X4 (GAUZE/BANDAGES/DRESSINGS) ×2 IMPLANT
SUT MNCRL AB 4-0 PS2 18 (SUTURE) ×3 IMPLANT
SUT MON AB 5-0 PS2 18 (SUTURE) IMPLANT
SUT SILK 2 0 SH (SUTURE) IMPLANT
SUT VIC AB 2-0 SH 27 (SUTURE) ×2
SUT VIC AB 2-0 SH 27XBRD (SUTURE) ×1 IMPLANT
SUT VIC AB 3-0 SH 27 (SUTURE) ×2
SUT VIC AB 3-0 SH 27X BRD (SUTURE) ×1 IMPLANT
SUT VIC AB 5-0 PS2 18 (SUTURE) IMPLANT
SYR CONTROL 10ML LL (SYRINGE) ×3 IMPLANT
TOWEL GREEN STERILE FF (TOWEL DISPOSABLE) ×3 IMPLANT
TRAY FAXITRON CT DISP (TRAY / TRAY PROCEDURE) ×3 IMPLANT
TUBE CONNECTING 20'X1/4 (TUBING)
TUBE CONNECTING 20X1/4 (TUBING) IMPLANT
YANKAUER SUCT BULB TIP NO VENT (SUCTIONS) IMPLANT

## 2019-02-14 NOTE — Anesthesia Postprocedure Evaluation (Signed)
Anesthesia Post Note  Patient: Kari Roberts  Procedure(s) Performed: RIGHT BREAST LUMPECTOMY WITH BRACKETED RADIOACTIVE SEED LOCALIZATION (Right Breast)     Patient location during evaluation: PACU Anesthesia Type: General Level of consciousness: awake and alert Pain management: pain level controlled Vital Signs Assessment: post-procedure vital signs reviewed and stable Respiratory status: spontaneous breathing, nonlabored ventilation, respiratory function stable and patient connected to nasal cannula oxygen Cardiovascular status: blood pressure returned to baseline and stable Postop Assessment: no apparent nausea or vomiting Anesthetic complications: no    Last Vitals:  Vitals:   02/14/19 1030 02/14/19 1040  BP: 126/80 139/86  Pulse: 80 90  Resp: 14 18  Temp:  36.5 C  SpO2: 100% 99%    Last Pain:  Vitals:   02/14/19 1040  TempSrc:   PainSc: 0-No pain                 Sheikh Leverich

## 2019-02-14 NOTE — Interval H&P Note (Signed)
History and Physical Interval Note:  02/14/2019 8:37 AM  Kari Roberts  has presented today for surgery, with the diagnosis of RIGHT BREAST CANCER.  The various methods of treatment have been discussed with the patient and family. After consideration of risks, benefits and other options for treatment, the patient has consented to  Procedure(s): RIGHT BREAST LUMPECTOMY WITH BRACKETED RADIOACTIVE SEED LOCALIZATION (Right) as a surgical intervention.  The patient's history has been reviewed, patient examined, no change in status, stable for surgery.  I have reviewed the patient's chart and labs.  Questions were answered to the patient's satisfaction.     Rolm Bookbinder

## 2019-02-14 NOTE — Op Note (Signed)
Preoperative diagnosis:clinical stage 0 right breast cancer Postoperative diagnosis: Same as above Procedure:Rightbreastseed bracketed lumpectomy Surgeon: Dr. Serita Grammes Anesthesia: General  Specimens: 1. Right breast tissue marked with paint, containing two seeds, third seed separate  2. Additionalright breast lateral, superior and posterior margins marked short superior, long lateral, double deep Estimated blood [id number] cc Complications: None Drains: None Sponge and count was correct at completion Disposition to recovery in stable condition  Indications: 57 yof with new right breast dcis. she has no personal history of breast disease. she has no family history. she had no mass or dc. she underwent dx mm for follow up of calcs and has 2 separate groups that are close to each other measuring 6 and 2.69mm. there is a small mass as well. ax [place] is negative. breasts are c density. she had biopsy of mass and is im node, concordant. biopsy of two breast masses are er/pr pos II-III DCIS and clips are 8 mm apart. she elected to undergo lumpectomy and we planned to place two seeds. She had three placed due to some migration. I had mammograms in OR>   Procedure: After informed consent was obtained shewas placed under general anesthesia without complication.She was given antibiotics. SCDs were in place. She was then prepped and draped in the standard sterile surgical fashion. A surgical timeout was then performed.  I then identified the seeds in the central portion and lower outer quadrant of the right breast.Imade aperiareolar incision to hide the scar later.Iinfiltrated Marcaine.I then tunneled to the seed using the neoprobe for guidance. I then removed the seeds and the surrounding tissue with an attempt to get a clear margin. One seed was readily visible and I removed this to gain control of it. Two seeds were on the specimen mammogram. Clips were present.  I then  removed some additional margins that looked like they might be close based on seed and 3Dimage.  I then placed clips in the cavity.  The posterior margin is the muscle.  I mobilized the breast tissue and closed this with 2-0 Vicryl, 3-0 Vicryl, and5-0 Monocryl.glue and steristrips were applied. She had a binder placed. She was extubated and transferred to recovery stable

## 2019-02-14 NOTE — Transfer of Care (Signed)
Immediate Anesthesia Transfer of Care Note  Patient: Kari Roberts  Procedure(s) Performed: RIGHT BREAST LUMPECTOMY WITH BRACKETED RADIOACTIVE SEED LOCALIZATION (Right Breast)  Patient Location: PACU  Anesthesia Type:General  Level of Consciousness: awake, alert  and oriented  Airway & Oxygen Therapy: Patient Spontanous Breathing and Patient connected to face mask oxygen  Post-op Assessment: Report given to RN and Post -op Vital signs reviewed and stable  Post vital signs: Reviewed and stable  Last Vitals:  Vitals Value Taken Time  BP 136/82 02/14/19 0955  Temp    Pulse 87 02/14/19 0959  Resp 12 02/14/19 0959  SpO2 100 % 02/14/19 0959  Vitals shown include unvalidated device data.  Last Pain:  Vitals:   02/14/19 0735  TempSrc: Temporal  PainSc: 0-No pain         Complications: No apparent anesthesia complications

## 2019-02-14 NOTE — Discharge Instructions (Signed)
[place] Office Phone Number [phone]  BREAST BIOPSY/ PARTIAL MASTECTOMY: POST OP INSTRUCTIONS Take 400 mg of ibuprofen every 8 hours or 650 mg tylenol every 6 hours for next 72 hours then as needed. Use ice several times daily also. Always review your discharge instruction sheet given to you by the facility where your surgery was performed.  IF YOU HAVE DISABILITY OR FAMILY LEAVE FORMS, YOU MUST BRING THEM TO THE OFFICE FOR PROCESSING.  DO NOT GIVE THEM TO YOUR DOCTOR.  1. A prescription for pain medication may be given to you upon discharge.  Take your pain medication as prescribed, if needed.  If narcotic pain medicine is not needed, then you may take acetaminophen (Tylenol), naprosyn (Alleve) or ibuprofen (Advil) as needed. No Tylenol until 2pm, no ibuprofen or naprosyn until 2pm 2. Take your usually prescribed medications unless otherwise directed 3. If you need a refill on your pain medication, please contact your pharmacy.  They will contact our office to request authorization.  Prescriptions will not be filled after 5pm or on week-ends. 4. You should eat very light the first 24 hours after surgery, such as soup, crackers, pudding, etc.  Resume your normal diet the day after surgery. 5. Most patients will experience some swelling and bruising in the breast.  Ice packs and a good support bra will help.  Wear the breast binder provided or a sports bra for 72 hours day and night.  After that wear a sports bra during the day until you return to the office. Swelling and bruising can take several days to resolve.  6. It is common to experience some constipation if taking pain medication after surgery.  Increasing fluid intake and taking a stool softener will usually help or prevent this problem from occurring.  A mild laxative (Milk of Magnesia or Miralax) should be taken according to package directions if there are no bowel movements after 48 hours. 7. Unless discharge  instructions indicate otherwise, you may remove your bandages 48 hours after surgery and you may shower at that time.  You may have steri-strips (small skin tapes) in place directly over the incision.  These strips should be left on the skin for 7-10 days and will come off on their own.  If your surgeon used skin glue on the incision, you may shower in 24 hours.  The glue will flake off over the next 2-3 weeks.  Any sutures or staples will be removed at the office during your follow-up visit. 8. ACTIVITIES:  You may resume regular daily activities (gradually increasing) beginning the next day.  Wearing a good support bra or sports bra minimizes pain and swelling.  You may have sexual intercourse when it is comfortable. a. You may drive when you no longer are taking prescription pain medication, you can comfortably wear a seatbelt, and you can safely maneuver your car and apply brakes. b. RETURN TO WORK:  ______________________________________________________________________________________ 9. You should see your doctor in the office for a follow-up appointment approximately two weeks after your surgery.  Your doctor's nurse will typically make your follow-up appointment when she calls you with your pathology report.  Expect your pathology report 3-4 business days after your surgery.  You may call to check if you do not hear from [place] after three days. 10. OTHER INSTRUCTIONS: _______________________________________________________________________________________________ _____________________________________________________________________________________________________________________________________ _____________________________________________________________________________________________________________________________________ _____________________________________________________________________________________________________________________________________  WHEN TO CALL DR WAKEFIELD: 1. Fever over  101.0 2. Nausea and/or vomiting. 3. Extreme swelling or bruising. 4. Continued bleeding from incision. 5. Increased  pain, redness, or drainage from the incision.  The clinic staff is available to answer your questions during regular business hours.  Please don't hesitate to call and ask to speak to one of the nurses for clinical concerns.  If you have a medical emergency, go to the nearest emergency room or call 911.  A surgeon from [facility] is always on call at the hospital.  For further questions, please visit [email or web address]    Post Anesthesia Home Care Instructions  Activity: Get plenty of rest for the remainder of the day. A responsible individual must stay with you for 24 hours following the procedure.  For the next 24 hours, DO NOT: -Drive a car -Paediatric nurse -Drink alcoholic beverages -Take any medication unless instructed by your physician -Make any legal decisions or sign important papers.  Meals: Start with liquid foods such as gelatin or soup. Progress to regular foods as tolerated. Avoid greasy, spicy, heavy foods. If nausea and/or vomiting occur, drink only clear liquids until the nausea and/or vomiting subsides. Call your physician if vomiting continues.  Special Instructions/Symptoms: Your throat may feel dry or sore from the anesthesia or the breathing tube placed in your throat during surgery. If this causes discomfort, gargle with warm salt water. The discomfort should disappear within 24 hours.  If you had a scopolamine patch placed behind your ear for the management of post- operative nausea and/or vomiting:  1. The medication in the patch is effective for 72 hours, after which it should be removed.  Wrap patch in a tissue and discard in the trash. Wash hands thoroughly with soap and water. 2. You may remove the patch earlier than 72 hours if you experience unpleasant side effects which may include dry mouth, dizziness or visual  disturbances. 3. Avoid touching the patch. Wash your hands with soap and water after contact with the patch.

## 2019-02-14 NOTE — Anesthesia Preprocedure Evaluation (Signed)
Anesthesia Evaluation  Patient identified by MRN, date of birth, ID band Patient awake    Reviewed: Allergy & Precautions, H&P , NPO status , Patient's Chart, lab work & pertinent test results, reviewed documented beta blocker date and time   Airway Mallampati: II  TM Distance: >3 FB Neck ROM: full    Dental no notable dental hx. (+) Teeth Intact, Dental Advisory Given   Pulmonary neg pulmonary ROS,    Pulmonary exam normal breath sounds clear to auscultation       Cardiovascular Exercise Tolerance: Good negative cardio ROS   Rhythm:regular Rate:Normal     Neuro/Psych negative neurological ROS  negative psych ROS   GI/Hepatic negative GI ROS, Neg liver ROS,   Endo/Other  negative endocrine ROS  Renal/GU negative Renal ROS  negative genitourinary   Musculoskeletal   Abdominal   Peds  Hematology negative hematology ROS (+)   Anesthesia Other Findings   Reproductive/Obstetrics negative OB ROS                             Anesthesia Physical Anesthesia Plan  ASA: II  Anesthesia Plan: General   Post-op Pain Management:    Induction: Intravenous  PONV Risk Score and Plan: 3 and Ondansetron, Treatment may vary due to age or medical condition, Dexamethasone and Midazolam  Airway Management Planned: LMA and Oral ETT  Additional Equipment:   Intra-op Plan:   Post-operative Plan:   Informed Consent: I have reviewed the patients History and Physical, chart, labs and discussed the procedure including the risks, benefits and alternatives for the proposed anesthesia with the patient or authorized representative who has indicated his/her understanding and acceptance.     Dental Advisory Given  Plan Discussed with: CRNA, Anesthesiologist and Surgeon  Anesthesia Plan Comments: ( )        Anesthesia Quick Evaluation

## 2019-02-14 NOTE — Anesthesia Procedure Notes (Addendum)
Procedure Name: LMA Insertion Date/Time: 02/14/2019 8:58 AM Performed by: [place] Rorie C, CRNA Pre-anesthesia Checklist: Patient identified, Emergency Drugs available, Suction available and Patient being monitored Patient Re-evaluated:Patient Re-evaluated prior to induction Oxygen Delivery Method: Circle system utilized Preoxygenation: Pre-oxygenation with 100% oxygen Induction Type: IV induction Ventilation: Mask ventilation without difficulty LMA: LMA inserted LMA Size: 4.0 Number of attempts: 1 Airway Equipment and Method: Bite block Placement Confirmation: positive ETCO2 Tube secured with: Tape Dental Injury: Teeth and Oropharynx as per pre-operative assessment

## 2019-02-14 NOTE — H&P (Signed)
13 yof referred by Dr Lindi Adie for new right breast dcis. she has no personal history of breast disease. she has no family history. she had no mass or dc. she works at [employer]. she underwent dx mm for follow up of calcs and has 2 separate groups that are close to each other measuring 6 and 2.39mm. there is a small mass as well. ax [place] is negative. breasts are c density. she had biopsy of mass and is im node, concordant. biopsy of two breast masses are er/pr pos II-III DCIS and clips are 8 mm apart. she is here with her husband to discuss options   Past Surgical History Tawni Pummel, RN; 02/01/2019 7:24 AM) Oral Surgery   Diagnostic Studies History Tawni Pummel, RN; 02/01/2019 7:24 AM) Colonoscopy  within last year Mammogram  within last year Pap Smear  1-5 years ago  Medication History Tawni Pummel, RN; 02/01/2019 7:24 AM) Medications Reconciled  Social History Tawni Pummel, RN; 02/01/2019 7:24 AM) Caffeine use  Carbonated beverages, Coffee. No alcohol use  No drug use  Tobacco use  Never smoker.  Family History Tawni Pummel, RN; 02/01/2019 7:24 AM) Breast Cancer  Sister. Cancer  Father.  Pregnancy / Birth History Tawni Pummel, RN; 02/01/2019 7:24 AM) Age at menarche  8 years. Contraceptive History  Oral contraceptives. Gravida  0 Para  0 Regular periods   Other Problems Tawni Pummel, RN; 02/01/2019 7:24 AM) Breast Cancer  No pertinent past medical history     Review of Systems Sunday Spillers Ledford RN; 02/01/2019 7:24 AM) General Not Present- Appetite Loss, Chills, Fatigue, Fever, Night Sweats, Weight Gain and Weight Loss. Skin Not Present- Change in Wart/Mole, Dryness, Hives, Jaundice, New Lesions, Non-Healing Wounds, Rash and Ulcer. HEENT Present- Ringing in the Ears. Not Present- Earache, Hearing Loss, Hoarseness, Nose Bleed, Oral Ulcers, Seasonal Allergies, Sinus Pain, Sore Throat, Visual Disturbances, Wears  glasses/contact lenses and Yellow Eyes. Respiratory Not Present- Bloody sputum, Chronic Cough, Difficulty Breathing, Snoring and Wheezing. Breast Not Present- Breast Mass, Breast Pain, Nipple Discharge and Skin Changes. Cardiovascular Not Present- Chest Pain, Difficulty Breathing Lying Down, Leg Cramps, Palpitations, Rapid Heart Rate, Shortness of Breath and Swelling of Extremities. Gastrointestinal Not Present- Abdominal Pain, Bloating, Bloody Stool, Change in Bowel Habits, Chronic diarrhea, Constipation, Difficulty Swallowing, Excessive gas, Gets full quickly at meals, Hemorrhoids, Indigestion, Nausea, Rectal Pain and Vomiting. Female Genitourinary Not Present- Frequency, Nocturia, Painful Urination, Pelvic Pain and Urgency. Musculoskeletal Not Present- Back Pain, Joint Pain, Joint Stiffness, Muscle Pain, Muscle Weakness and Swelling of Extremities. Neurological Not Present- Decreased Memory, Fainting, Headaches, Numbness, Seizures, Tingling, Tremor, Trouble walking and Weakness. Psychiatric Not Present- Anxiety, Bipolar, Change in Sleep Pattern, Depression, Fearful and Frequent crying. Endocrine Not Present- Cold Intolerance, Excessive Hunger, Hair Changes, Heat Intolerance, Hot flashes and New Diabetes. Hematology Not Present- Blood Thinners, Easy Bruising, Excessive bleeding, Gland problems, HIV and Persistent Infections.   Physical Exam Rolm Bookbinder MD; 02/01/2019 11:47 AM) General Mental Status-Alert. Orientation-Oriented X3.  Head and Neck Trachea-midline. Thyroid Gland Characteristics - normal size and consistency.  Eye Sclera/Conjunctiva - Bilateral-No scleral icterus.  Chest and Lung Exam Chest and lung exam reveals -quiet, even and easy respiratory effort with no use of accessory muscles.  Breast Nipples-No Discharge. Breast Lump-No Palpable Breast Mass.  Neurologic Neurologic evaluation reveals -alert and oriented x 3 with no impairment of recent  or remote memory.  Lymphatic Head & Neck  General Head & Neck Lymphatics: Bilateral - Description - Normal. Axillary  General Axillary Region: Bilateral - Description - Normal. Note: no  adenopathy   Assessment & Plan Rolm Bookbinder MD; 02/01/2019 11:48 AM) BREAST NEOPLASM, TIS (DCIS), RIGHT (D05.11) Story: Right breast seed bracketed lumpectomy We discussed the staging and pathophysiology of breast cancer. We discussed all of the different options for treatment for breast cancer including surgery, chemotherapy, radiation therapy, Herceptin, and antiestrogen therapy. We discussed noninvasive nature of her disease. no indication for node biopsy We discussed the options for treatment of the breast cancer which included lumpectomy versus a mastectomy. We discussed the performance of the lumpectomy with radioactive seed placement. We discussed a 5-10% chance of a positive margin requiring reexcision in the operating room. We also discussed that she will likely need radiation therapy if she undergoes lumpectomy. The breast cannot undergo more radiation therapy in the same breast after lumpectomy in the future. We discussed mastectomy and the postoperative care for that as well. Mastectomy can be followed by reconstruction. The decision for lumpectomy vs mastectomy has no impact on decision for chemotherapy. Most mastectomy patients will not need radiation therapy. We discussed that there is no difference in her survival whether she undergoes lumpectomy with radiation therapy or antiestrogen therapy versus a mastectomy. There is also no real difference between her recurrence in the breast. We discussed the risks of operation including bleeding, infection, possible reoperation. She understands her further therapy will be based on what her stages at the time of her operation.

## 2019-02-15 ENCOUNTER — Encounter: Payer: Self-pay | Admitting: *Deleted

## 2019-02-16 ENCOUNTER — Other Ambulatory Visit: Payer: Self-pay

## 2019-02-17 LAB — SURGICAL PATHOLOGY

## 2019-02-20 ENCOUNTER — Encounter: Payer: Self-pay | Admitting: *Deleted

## 2019-02-27 NOTE — Progress Notes (Signed)
Patient Care Team: Renne Crigler, NP as PCP - General (Nurse Practitioner) Mauro Kaufmann, RN as Oncology Nurse Navigator Rockwell Germany, RN as Oncology Nurse Navigator Rolm Bookbinder, MD as Consulting Physician (General Surgery) Nicholas Lose, MD as Consulting Physician (Hematology and Oncology) Gery Pray, MD as Consulting Physician (Radiation Oncology)  DIAGNOSIS:    ICD-10-CM   1. Ductal carcinoma in situ (DCIS) of right breast  D05.11     SUMMARY OF ONCOLOGIC HISTORY: Oncology History  Ductal carcinoma in situ (DCIS) of right breast  01/25/2019 Initial Diagnosis   Patient had history of probably benign right breast calcifications. Mammogram showed two groups of right breast calcifications measuring 0.6cm and 0.3cm and a right breast mass measuring 0.7cm, with no axillary adenopathy. Biopsy on 01/23/19 showed DCIS with calcifications and focal necrosis at the lateral anterior position and lateral posterior position, high grade, ER+ 100%, PR+ 95%. And a benign lymph node representing the mass at the 9 o'clock position.   02/14/2019 Surgery   Right lumpectomy [name]): DCIS, intermediate grade, clear margins.      CHIEF COMPLIANT: Follow-up s/p right lumpectomy to review pathology  INTERVAL HISTORY: Kari Roberts is a 54 y.o. with above-mentioned history of right breast DCIS. She underwent a right lumpectomy on 02/14/19 with Dr. [name] for which pathology showed DCIS, intermediate grade, clear margins. She presents to the clinic today to discuss the pathology report and further treatment.  She is healing and recovering very well from the recent surgery.  She does not have any pain or discomfort.  ALLERGIES:  is allergic to augmentin [amoxicillin-pot clavulanate].  MEDICATIONS:  Current Outpatient Medications  Medication Sig Dispense Refill  . oxyCODONE (OXY IR/ROXICODONE) 5 MG immediate release tablet Take 1-2 tablets (5-10 mg total) by mouth every 6 (six)  hours as needed for moderate pain, severe pain or breakthrough pain. 5 tablet 0   No current facility-administered medications for this visit.    PHYSICAL EXAMINATION: ECOG PERFORMANCE STATUS: 1 - Symptomatic but completely ambulatory  There were no vitals filed for this visit. There were no vitals filed for this visit.  LABORATORY DATA:  I have reviewed the data as listed CMP Latest Ref Rng & Units 02/01/2019  Glucose 70 - 99 mg/dL 129(H)  BUN 6 - 20 mg/dL 7  Creatinine 0.44 - 1.00 mg/dL 0.75  Sodium 135 - 145 mmol/L 140  Potassium 3.5 - 5.1 mmol/L 4.1  Chloride 98 - 111 mmol/L 104  CO2 22 - 32 mmol/L 24  Calcium 8.9 - 10.3 mg/dL 9.2  Total Protein 6.5 - 8.1 g/dL 7.7  Total Bilirubin 0.3 - 1.2 mg/dL 0.3  Alkaline Phos 38 - 126 U/L 96  AST 15 - 41 U/L 19  ALT 0 - 44 U/L 20    Lab Results  Component Value Date   WBC 6.0 02/01/2019   HGB 14.5 02/01/2019   HCT 43.7 02/01/2019   MCV 87.2 02/01/2019   PLT 256 02/01/2019   NEUTROABS 3.9 02/01/2019    ASSESSMENT & PLAN:  Ductal carcinoma in situ (DCIS) of right breast 02/14/2019: Right lumpectomy (Dr. [name]): Intermediate grade focus of DCIS 0.2 cm, margins negative, ER 100%, PR 90% Tis NX stage 0  Pathology counseling: I discussed the final pathology report of the patient provided  a copy of this report. I discussed the margins. We also discussed the final staging along with previously performed ER/PR testing.  Treatment plan: 1. Adjuvant radiation therapy 2. Followed by antiestrogen therapy with  tamoxifen 5 years  I told a letter for her to go back to work. I printed out all of the information that she requested for [employer] insurance. Returning to the end of radiation to start antiestrogen therapy.  No orders of the defined types were placed in this encounter.  The patient has a good understanding of the overall plan. she agrees with it. she will call with any problems that may develop before the next visit  here.  Total time spent: 30 mins including face to face time and time spent for planning, charting and coordination of care  Nicholas Lose, MD 02/28/2019  I, Cloyde Reams Dorshimer, am acting as scribe for Dr. Nicholas Lose.  I have reviewed the above documentation for accuracy and completeness, and I agree with the above.

## 2019-02-28 ENCOUNTER — Inpatient Hospital Stay: Payer: BC Managed Care – PPO | Attending: Hematology and Oncology | Admitting: Hematology and Oncology

## 2019-02-28 ENCOUNTER — Other Ambulatory Visit: Payer: Self-pay

## 2019-02-28 DIAGNOSIS — Z170 Estrogen receptor positive status [ER+]: Secondary | ICD-10-CM | POA: Diagnosis not present

## 2019-02-28 DIAGNOSIS — Z79810 Long term (current) use of selective estrogen receptor modulators (SERMs): Secondary | ICD-10-CM | POA: Insufficient documentation

## 2019-02-28 DIAGNOSIS — D0511 Intraductal carcinoma in situ of right breast: Secondary | ICD-10-CM | POA: Diagnosis present

## 2019-02-28 NOTE — Assessment & Plan Note (Signed)
02/14/2019: Right lumpectomy (Dr. [name]): Intermediate grade focus of DCIS 0.2 cm, margins negative, ER 100%, PR 90% Tis NX stage 0  Pathology counseling: I discussed the final pathology report of the patient provided  a copy of this report. I discussed the margins. We also discussed the final staging along with previously performed ER/PR testing.  Treatment plan: 1. Adjuvant radiation therapy 2. Followed by antiestrogen therapy with tamoxifen 5 years  Returning to the end of radiation to start antiestrogen therapy.

## 2019-03-01 ENCOUNTER — Telehealth: Payer: Self-pay | Admitting: Hematology and Oncology

## 2019-03-01 NOTE — Telephone Encounter (Signed)
I left a message regarding schedule for 2/3

## 2019-03-06 NOTE — Progress Notes (Signed)
Location of Breast Cancer: Ductal carcinoma in situ (DCIS) of right breast  Histology per Pathology Report: 02/14/19: FINAL MICROSCOPIC DIAGNOSIS:   A. BREAST, RIGHT, LUMPECTOMY:  - Focus of ductal carcinoma in situ, intermediate nuclear grade.  - Margins of resection are not involved (Closest margin: 9 mm,  posterior).  - Biopsy site changes.  - See oncology table.   B. BREAST, RIGHT, ADDITIONAL SUPERIOR MARGIN, EXCISION:  - Breast tissue, negative for carcinoma.   C. BREAST, RIGHT, ADDITIONAL LATERAL MARGIN, EXCISION:  - Breast tissue, negative for carcinoma.   D. BREAST, RIGHT, ADDITIONAL POSTERIOR MARGIN, EXCISION:  - Breast tissue, negative for carcinoma  Receptor Status: ER(100%), PR (95%)  Did patient present with symptoms (if so, please note symptoms) or was this found on screening mammography?: Patient has a known history of right breast calcifications that were being followed since 12/29/2016. She underwent routine bilateral diagnostic mammography with tomography and right breast ultrasonography on 01/17/2019 at [facility], which showed two groups of changing right breast calcifications and a new right breast mass at 9 o'clock. No suspicious findings on the left.   Biopsy on 01/23/2019 showed intermediate to high grade ductal carcinoma in situ with calcifications and focal necrosis of both the lateral anterior and posterior aspects of the right breast. Right axillary lymph node was negative. Prognostic indicators significant for: estrogen receptor, 100% positive and progesterone receptor, 95% positive, both with strong staining intensity.  Past/Anticipated interventions by surgeon, if any: 02/14/19: Procedure:Rightbreastseed bracketed lumpectomy Surgeon: Dr. Serita Grammes  Past/Anticipated interventions by medical oncology, if any: Chemotherapy Per Dr. Lindi Adie 02/28/19:  ASSESSMENT & PLAN:  Ductal carcinoma in situ (DCIS) of right breast 02/14/2019: Right lumpectomy  (Dr. [name]): Intermediate grade focus of DCIS 0.2 cm, margins negative, ER 100%, PR 90% Tis NX stage 0  Pathology counseling: I discussed the final pathology report of the patient provided  a copy of this report. I discussed the margins. We also discussed the final staging along with previously performed ER/PR testing.  Treatment plan: 1. Adjuvant radiation therapy 2. Followed by antiestrogen therapy with tamoxifen 5 years  I told a letter for her to go back to work. I printed out all of the information that she requested for [employer] insurance. Returning to the end of radiation to start antiestrogen therapy.   Lymphedema issues, if any:  No lymphedema.  Pain issues, if any: Notes some tenderness at the incision site.  SAFETY ISSUES:  Prior radiation? no  Pacemaker/ICD? no  Possible current pregnancy?no  Is the patient on methotrexate? no  Current Complaints / other details:      Loma Sousa, RN 03/06/2019,11:05 AM

## 2019-03-07 ENCOUNTER — Telehealth: Payer: Self-pay | Admitting: Genetic Counselor

## 2019-03-07 NOTE — Progress Notes (Signed)
Radiation Oncology         [phone] ________________________________  Name: Kari Roberts MRN: [id number]  Date: 03/08/2019  DOB: [birth year]  Re-Evaluation Note  CC: Renne Crigler, NP  Renne Crigler, NP    ICD-10-CM   1. Ductal carcinoma in situ (DCIS) of right breast  D05.11     Diagnosis: Stage 0, Right Breast UOQ, Ductal Carcinoma DCIS, ER+ / PR+, Grade 2  Narrative: The patient returns today to discuss radiation treatment options. She was seen in the multidisciplinary breast clinic on 02/01/2019.  She opted to proceed with right breast lumpectomy without nodal biopsy on 02/14/2019. Pathology from the procedure revealed grade 2 focus of ductal carcinoma in situ of right breast. Margins of resection were not involved. Superior, lateral, and posterior margins were all negative for carcinoma.  The patient was seen by Dr. Lindi Adie on 02/28/2019 during which time they discussed the plan for adjuvant radiation therapy followed by antiestrogen therapy with Tamoxifen x5 years.  On review of systems, the patient reports mild discomfort in the right breast but no actual pain. She denies fever chills nipple discharge or bleeding and any other symptoms.    Allergies:  is allergic to augmentin [amoxicillin-pot clavulanate].  Meds: Current Outpatient Medications  Medication Sig Dispense Refill  . oxyCODONE (OXY IR/ROXICODONE) 5 MG immediate release tablet Take 1-2 tablets (5-10 mg total) by mouth every 6 (six) hours as needed for moderate pain, severe pain or breakthrough pain. (Patient not taking: Reported on 03/08/2019) 5 tablet 0   No current facility-administered medications for this encounter.    Physical Findings: The patient is in no acute distress. Patient is alert and oriented.  height is 5' 3"  (1.6 m) and weight is 137 lb 2 oz (62.2 kg). Her temporal temperature is 97.8 F (36.6 C). Her blood pressure is 130/93 (abnormal) and her pulse is 99. Her respiration is 18 and  oxygen saturation is 100%.   Lungs are clear to auscultation bilaterally. Heart has regular rate and rhythm. No palpable cervical, supraclavicular, or axillary adenopathy. Abdomen soft, non-tender, normal bowel sounds. Right breast: The patient has a periareolar scar which is healing well without signs of drainage or infection.  Mild swelling noted in the breast.  Lab Findings: Lab Results  Component Value Date   WBC 6.0 02/01/2019   HGB 14.5 02/01/2019   HCT 43.7 02/01/2019   MCV 87.2 02/01/2019   PLT 256 02/01/2019    Radiographic Findings: MM Breast Surgical Specimen  Result Date: 02/14/2019 CLINICAL DATA:  A total of 3 radioactive seeds were placed on February 13, 2019 as described on the radioactive seed localization report of the right breast of that date. X shaped and coil shaped biopsy clips were the targets for the seeds. EXAM: SPECIMEN RADIOGRAPH OF THE RIGHT BREAST COMPARISON:  Previous exam(s). FINDINGS: Status post excision of the right breast. Two radioactive seeds, the coil shaped biopsy clip, and the x shaped biopsy clip were in the tissue specimen. In a separate cup, the third radioactive seed is visualized. This seeds and clips are present and completely intact. IMPRESSION: Specimen radiograph of the right breast. Electronically Signed   By: Curlene Dolphin M.D.   On: 02/14/2019 09:46   MM RT RADIOACTIVE SEED LOC MAMMO GUIDE  Result Date: 02/13/2019 CLINICAL DATA:  Patient presents for mammographically guided radioactive seed localization of 2 recently biopsied areas carcinoma in the right breast. EXAM: MAMMOGRAPHIC GUIDED RADIOACTIVE SEED LOCALIZATION OF THE RIGHT BREAST: 3 RADIOACTIVE  SEEDS PLACED COMPARISON:  Previous exam(s). FINDINGS: Patient presents for radioactive seed localization prior to surgical excision. I met with the patient and we discussed the procedure of seed localization including benefits and alternatives. We discussed the high likelihood of a successful  procedure. We discussed the risks of the procedure including infection, bleeding, tissue injury and further surgery. We discussed the low dose of radioactivity involved in the procedure. Informed, written consent was given. The usual time-out protocol was performed immediately prior to the procedure. Using mammographic guidance, sterile technique, 1% lidocaine and an I-125 radioactive seed, the coil shaped biopsy clip was localized using a lateral approach. The follow-up mammogram images confirm the seed in the expected location and were marked for Dr. [name]. Follow-up survey of the patient confirms presence of the radioactive seed. Order number of I-125 seed:  [id number]. Total activity:  7.124 millicuries reference Date: 01/19/2019 Using mammographic guidance, sterile technique, 1% lidocaine and an I-125 radioactive seed, the X shaped biopsy clip was localized using a lateral approach. The follow-up mammogram images confirm the seed in the expected location and were marked for Dr. [name]. Follow-up survey of the patient confirms presence of the radioactive seed. Order number of I-125 seed:  [id number]. Total activity:  2.505 millicuries reference Date: 01/19/2019. Initial attempt at localizing the X shaped biopsy clip had the radioactive seed displaced medially, lying directly adjacent to the radioactive seed localizing the coil shaped biopsy clip. For this reason, a third radioactive seed was placed, to more precisely localize the X shaped biopsy clip. This seed has the same order number, total activity and reference state as the other 2 radioactive seeds. The patient tolerated the procedure well and was released from the Breast Center. She was given instructions regarding seed removal. IMPRESSION: Radioactive seed localization right breast. No apparent complications. A total of 3 radioactive seeds localize the coil and X shaped biopsy clips. Therefore, the surgical sample should contain 3 radioactive seeds  as well as a coil shaped clip and an X shaped biopsy clip. Electronically Signed   By: Lajean Manes M.D.   On: 02/13/2019 15:47   MM RT RADIO SEED EA ADD LESION LOC MAMMO  Result Date: 02/13/2019 CLINICAL DATA:  Patient presents for mammographically guided radioactive seed localization of 2 recently biopsied areas carcinoma in the right breast. EXAM: MAMMOGRAPHIC GUIDED RADIOACTIVE SEED LOCALIZATION OF THE RIGHT BREAST: 3 RADIOACTIVE SEEDS PLACED COMPARISON:  Previous exam(s). FINDINGS: Patient presents for radioactive seed localization prior to surgical excision. I met with the patient and we discussed the procedure of seed localization including benefits and alternatives. We discussed the high likelihood of a successful procedure. We discussed the risks of the procedure including infection, bleeding, tissue injury and further surgery. We discussed the low dose of radioactivity involved in the procedure. Informed, written consent was given. The usual time-out protocol was performed immediately prior to the procedure. Using mammographic guidance, sterile technique, 1% lidocaine and an I-125 radioactive seed, the coil shaped biopsy clip was localized using a lateral approach. The follow-up mammogram images confirm the seed in the expected location and were marked for Dr. [name]. Follow-up survey of the patient confirms presence of the radioactive seed. Order number of I-125 seed:  [id number]. Total activity:  3.790 millicuries reference Date: 01/19/2019 Using mammographic guidance, sterile technique, 1% lidocaine and an I-125 radioactive seed, the X shaped biopsy clip was localized using a lateral approach. The follow-up mammogram images confirm the seed in the expected location and were marked for Dr.  Wakefield. Follow-up survey of the patient confirms presence of the radioactive seed. Order number of I-125 seed:  [id number]. Total activity:  0.045 millicuries reference Date: 01/19/2019. Initial attempt at  localizing the X shaped biopsy clip had the radioactive seed displaced medially, lying directly adjacent to the radioactive seed localizing the coil shaped biopsy clip. For this reason, a third radioactive seed was placed, to more precisely localize the X shaped biopsy clip. This seed has the same order number, total activity and reference state as the other 2 radioactive seeds. The patient tolerated the procedure well and was released from the Breast Center. She was given instructions regarding seed removal. IMPRESSION: Radioactive seed localization right breast. No apparent complications. A total of 3 radioactive seeds localize the coil and X shaped biopsy clips. Therefore, the surgical sample should contain 3 radioactive seeds as well as a coil shaped clip and an X shaped biopsy clip. Electronically Signed   By: Lajean Manes M.D.   On: 02/13/2019 15:47    Impression: Stage 0, Right Breast UOQ, Ductal Carcinoma DCIS, ER+ / PR+, Grade 2  Patient will be a good candidate for radiation therapy as breast conserving treatment.  Given the patient's young age I would not recommend lumpectomy alone in this situation.  I discussed the general course of treatment, side effects and potential toxicities of radiation therapy with the patient.  She appears to understand and wishes to proceed with planned course of treatment.  She would be a good candidate for hypofractionated accelerated radiation therapy which will be helpful  given her long travel distance to the radiation facility.  Plan:  Patient is scheduled for CT simulation later today.  Anticipate treatment starting next week.  20 treatments planned.  -----------------------------------  Blair Promise, PhD, MD  This document serves as a record of services personally performed by Gery Pray, MD. It was created on his behalf by Clerance Lav, a trained medical scribe. The creation of this record is based on the scribe's personal observations and the  provider's statements to them. This document has been checked and approved by the attending provider.

## 2019-03-07 NOTE — Telephone Encounter (Signed)
I left a message regarding video visit 2/3

## 2019-03-08 ENCOUNTER — Inpatient Hospital Stay: Payer: BC Managed Care – PPO | Attending: Hematology and Oncology | Admitting: Genetic Counselor

## 2019-03-08 ENCOUNTER — Ambulatory Visit
Admission: RE | Admit: 2019-03-08 | Discharge: 2019-03-08 | Disposition: A | Discharge status: Home / Self Care | Payer: BC Managed Care – PPO | Source: Ambulatory Visit | Attending: Radiation Oncology | Admitting: Radiation Oncology

## 2019-03-08 ENCOUNTER — Inpatient Hospital Stay: Payer: BC Managed Care – PPO

## 2019-03-08 ENCOUNTER — Other Ambulatory Visit: Payer: Self-pay

## 2019-03-08 ENCOUNTER — Encounter: Payer: Self-pay | Admitting: Radiation Oncology

## 2019-03-08 ENCOUNTER — Encounter: Payer: Self-pay | Admitting: *Deleted

## 2019-03-08 ENCOUNTER — Encounter: Payer: Self-pay | Admitting: Genetic Counselor

## 2019-03-08 VITALS — BP 130/93 | HR 99 | Temp 97.8°F | Resp 18 | Ht 63.0 in | Wt 137.1 lb

## 2019-03-08 DIAGNOSIS — D0511 Intraductal carcinoma in situ of right breast: Secondary | ICD-10-CM | POA: Insufficient documentation

## 2019-03-08 DIAGNOSIS — Z803 Family history of malignant neoplasm of breast: Secondary | ICD-10-CM | POA: Diagnosis not present

## 2019-03-08 DIAGNOSIS — Z510 Encounter for antineoplastic radiation therapy: Secondary | ICD-10-CM | POA: Diagnosis not present

## 2019-03-08 NOTE — Progress Notes (Signed)
REFERRING PROVIDER: Nicholas Lose, MD [place],  [place] [address]  PRIMARY PROVIDER:  Renne Crigler, NP  PRIMARY REASON FOR VISIT:  1. Ductal carcinoma in situ (DCIS) of right breast   2. Family history of breast cancer      HISTORY OF PRESENT ILLNESS:   Kari Roberts, a 54 y.o. female, was seen for a [place] cancer genetics consultation at the request of Dr. Lindi Adie due to a personal and family history of breast cancer.  Kari Roberts presents to clinic today to discuss the possibility of a hereditary predisposition to cancer, genetic testing, and to further clarify her future cancer risks, as well as potential cancer risks for family members.   In December 2020, at the age of 4, Kari Roberts was diagnosed with DCIS of the right breast. The treatment plan included a lumpectomy and she will start radiation soon.      CANCER HISTORY:  Oncology History  Ductal carcinoma in situ (DCIS) of right breast  01/25/2019 Initial Diagnosis   Patient had history of probably benign right breast calcifications. Mammogram showed two groups of right breast calcifications measuring 0.6cm and 0.3cm and a right breast mass measuring 0.7cm, with no axillary adenopathy. Biopsy on 01/23/19 showed DCIS with calcifications and focal necrosis at the lateral anterior position and lateral posterior position, high grade, ER+ 100%, PR+ 95%. And a benign lymph node representing the mass at the 9 o'clock position.   02/14/2019 Surgery   Right lumpectomy [name]): DCIS, intermediate grade, clear margins.       RISK FACTORS:  Menarche was at age 6.  First live birth at age N/A.  Ovaries intact: yes.  Hysterectomy: no.  Menopausal status: perimenopausal.  HRT use: 0 years. Colonoscopy: yes; normal. Mammogram within the last year: yes. Number of breast biopsies: 1. Up to date with pelvic exams: yes. Any excessive radiation exposure in the past: no  Past Medical History:  Diagnosis  Date  . Cancer [facility])    recent breast CA diagnosis  . Family history of breast cancer     Past Surgical History:  Procedure Laterality Date  . BREAST LUMPECTOMY WITH RADIOACTIVE SEED LOCALIZATION Right 02/14/2019   Procedure: RIGHT BREAST LUMPECTOMY WITH BRACKETED RADIOACTIVE SEED LOCALIZATION;  Surgeon: Rolm Bookbinder, MD;  Location: [place];  Service: General;  Laterality: Right;    Social History   Socioeconomic History  . Marital status: Married    Spouse name: Ricca Melgarejo  . Number of children: Not on file  . Years of education: Not on file  . Highest education level: Not on file  Occupational History  . Not on file  Tobacco Use  . Smoking status: Never Smoker  . Smokeless tobacco: Never Used  Substance and Sexual Activity  . Alcohol use: Never  . Drug use: Never  . Sexual activity: Not on file  Other Topics Concern  . Not on file  Social History Narrative  . Not on file   Social Determinants of Health   Financial Resource Strain:   . Difficulty of Paying Living Expenses: Not on file  Food Insecurity:   . Worried About Charity fundraiser in the Last Year: Not on file  . Ran Out of Food in the Last Year: Not on file  Transportation Needs:   . Lack of Transportation (Medical): Not on file  . Lack of Transportation (Non-Medical): Not on file  Physical Activity:   . Days of Exercise per Week: Not  on file  . Minutes of Exercise per Session: Not on file  Stress:   . Feeling of Stress : Not on file  Social Connections:   . Frequency of Communication with Friends and Family: Not on file  . Frequency of Social Gatherings with Friends and Family: Not on file  . Attends Religious Services: Not on file  . Active Member of Clubs or Organizations: Not on file  . Attends Archivist Meetings: Not on file  . Marital Status: Not on file     FAMILY HISTORY:  We obtained a detailed, 4-generation family history.  Significant diagnoses are  listed below: Family History  Problem Relation Age of Onset  . Lung cancer Father 52  . Breast cancer Roberts 65  . Stomach cancer Maternal Uncle 82    The patient does not have children.  She has two sisters and a brother who are cancer free.  Her mother is living and her father is deceased.  The patient's mother had four brothers and a Roberts.  One brother had stomach cancer.  There is no other reported history of cancer.  The patients father died of lung cancer.  He had a brother and Roberts and a maternal half brother.  There is no reported family history of cancer on this side of the family.  Kari Roberts is unaware of previous family history of genetic testing for hereditary cancer risks. Patient's maternal ancestors are of Caucasian descent, and paternal ancestors are of Caucasian descent. There is no reported Ashkenazi Jewish ancestry. There is no known consanguinity.    GENETIC COUNSELING ASSESSMENT: Kari Roberts is a 54 y.o. female with a personal and family history of breast cancer which is somewhat suggestive of a familial predisposition to cancer given the ages of onset of cancer. We, therefore, discussed and recommended the following at today's visit.   DISCUSSION: We discussed that 5 - 10% of breast cancer is hereditary, with most cases associated with BRCA mutations.  There are other genes that can be associated with hereditary breast cancer syndromes.  These include ATM, CHEK2 and PALB2.  We discussed that testing is beneficial for several reasons including knowing how to follow individuals after completing their treatment, identifying whether potential treatment options such as PARP inhibitors would be beneficial, and understand if other family members could be at risk for cancer and allow them to undergo genetic testing.   We discussed with Kari Roberts that the personal and family history does not meet insurance or NCCN criteria for genetic testing and, therefore, is not highly  consistent with a familial hereditary cancer syndrome. We discussed that, unfortunately, she is 1 month from meeting criteria, as her birthday was in November and she was diagnosed in December. We feel she is at low risk to harbor a gene mutation associated with such a condition, but could do a benefits investigation to determine if there would be an out of pocket cost.  Out of pocket testing could be $250. Thus, we did not recommend any genetic testing, at this time, and recommended Kari Roberts continue to follow the cancer screening guidelines given by her primary healthcare provider.  PLAN: Kari Roberts did not wish to pursue genetic testing at today's visit. We understand this decision and remain available to coordinate genetic testing at any time in the future. Kari Roberts reportedly is waiting for her genetic test results. We discussed that if her Roberts tests positive for a pathogenic variant, she would then be eligible  for testing and we could pursue testing at that time. We, therefore, recommend Kari Roberts continue to follow the cancer screening guidelines given by her primary healthcare provider.  Lastly, we encouraged Kari Roberts to remain in contact with cancer genetics annually so that we can continuously update the family history and inform her of any changes in cancer genetics and testing that may be of benefit for this family.   Kari Roberts questions were answered to her satisfaction today. Our contact information was provided should additional questions or concerns arise. Thank you for the referral and allowing [place] to share in the care of your patient.   Dontrail Blackwell P. Florene Roberts, [place], [facility] Licensed, Insurance risk surveyor [email or web address] phone: [phone]  The patient was seen for a total of 45 minutes in face-to-face genetic counseling.  This patient was discussed with Drs. Magrinat, Lindi Adie and/or Burr Medico who agrees with the above.     _______________________________________________________________________ For Office Staff:  Number of people involved in session: 1 Was an Intern/ student involved with case: no

## 2019-03-11 DIAGNOSIS — D0511 Intraductal carcinoma in situ of right breast: Secondary | ICD-10-CM | POA: Diagnosis not present

## 2019-03-13 ENCOUNTER — Telehealth: Payer: Self-pay | Admitting: Hematology and Oncology

## 2019-03-13 NOTE — Telephone Encounter (Signed)
I left a message regarding 3/8

## 2019-03-16 ENCOUNTER — Other Ambulatory Visit: Payer: Self-pay

## 2019-03-16 ENCOUNTER — Ambulatory Visit
Admission: RE | Admit: 2019-03-16 | Discharge: 2019-03-16 | Disposition: A | Discharge status: Home / Self Care | Payer: BC Managed Care – PPO | Source: Ambulatory Visit | Attending: Radiation Oncology | Admitting: Radiation Oncology

## 2019-03-16 DIAGNOSIS — D0511 Intraductal carcinoma in situ of right breast: Secondary | ICD-10-CM

## 2019-03-16 NOTE — Progress Notes (Signed)
  Radiation Oncology         [phone] ________________________________  Name: Kari Roberts MRN: [id number]  Date: 03/16/2019  DOB: [birth year]  Simulation Verification Note    ICD-10-CM   1. Ductal carcinoma in situ (DCIS) of right breast  D05.11     Status: outpatient  NARRATIVE: The patient was brought to the treatment unit and placed in the planned treatment position. The clinical setup was verified. Then port films were obtained and uploaded to the radiation oncology medical record software.  The treatment beams were carefully compared against the planned radiation fields. The position location and shape of the radiation fields was reviewed. They targeted volume of tissue appears to be appropriately covered by the radiation beams. Organs at risk appear to be excluded as planned.  Based on my personal review, I approved the simulation verification. The patient's treatment will proceed as planned.  -----------------------------------  Blair Promise, PhD, MD

## 2019-03-17 ENCOUNTER — Other Ambulatory Visit: Payer: Self-pay

## 2019-03-17 ENCOUNTER — Ambulatory Visit
Admission: RE | Admit: 2019-03-17 | Discharge: 2019-03-17 | Disposition: A | Discharge status: Home / Self Care | Payer: BC Managed Care – PPO | Source: Ambulatory Visit | Attending: Radiation Oncology | Admitting: Radiation Oncology

## 2019-03-17 DIAGNOSIS — D0511 Intraductal carcinoma in situ of right breast: Secondary | ICD-10-CM | POA: Diagnosis not present

## 2019-03-20 ENCOUNTER — Ambulatory Visit
Admission: RE | Admit: 2019-03-20 | Discharge: 2019-03-20 | Disposition: A | Discharge status: Home / Self Care | Payer: BC Managed Care – PPO | Source: Ambulatory Visit | Attending: Radiation Oncology | Admitting: Radiation Oncology

## 2019-03-20 ENCOUNTER — Other Ambulatory Visit: Payer: Self-pay

## 2019-03-20 DIAGNOSIS — D0511 Intraductal carcinoma in situ of right breast: Secondary | ICD-10-CM | POA: Diagnosis not present

## 2019-03-21 ENCOUNTER — Other Ambulatory Visit: Payer: Self-pay

## 2019-03-21 ENCOUNTER — Ambulatory Visit
Admission: RE | Admit: 2019-03-21 | Discharge: 2019-03-21 | Disposition: A | Discharge status: Home / Self Care | Payer: BC Managed Care – PPO | Source: Ambulatory Visit | Attending: Radiation Oncology | Admitting: Radiation Oncology

## 2019-03-21 DIAGNOSIS — D0511 Intraductal carcinoma in situ of right breast: Secondary | ICD-10-CM | POA: Diagnosis not present

## 2019-03-22 ENCOUNTER — Ambulatory Visit
Admission: RE | Admit: 2019-03-22 | Discharge: 2019-03-22 | Disposition: A | Discharge status: Home / Self Care | Payer: BC Managed Care – PPO | Source: Ambulatory Visit | Attending: Radiation Oncology | Admitting: Radiation Oncology

## 2019-03-22 ENCOUNTER — Other Ambulatory Visit: Payer: Self-pay

## 2019-03-22 DIAGNOSIS — D0511 Intraductal carcinoma in situ of right breast: Secondary | ICD-10-CM | POA: Diagnosis not present

## 2019-03-23 ENCOUNTER — Other Ambulatory Visit: Payer: Self-pay

## 2019-03-23 ENCOUNTER — Ambulatory Visit
Admission: RE | Admit: 2019-03-23 | Discharge: 2019-03-23 | Disposition: A | Discharge status: Home / Self Care | Payer: BC Managed Care – PPO | Source: Ambulatory Visit | Attending: Radiation Oncology | Admitting: Radiation Oncology

## 2019-03-23 DIAGNOSIS — D0511 Intraductal carcinoma in situ of right breast: Secondary | ICD-10-CM | POA: Diagnosis not present

## 2019-03-24 ENCOUNTER — Other Ambulatory Visit: Payer: Self-pay

## 2019-03-24 ENCOUNTER — Ambulatory Visit
Admission: RE | Admit: 2019-03-24 | Discharge: 2019-03-24 | Disposition: A | Discharge status: Home / Self Care | Payer: BC Managed Care – PPO | Source: Ambulatory Visit | Attending: Radiation Oncology | Admitting: Radiation Oncology

## 2019-03-24 DIAGNOSIS — D0511 Intraductal carcinoma in situ of right breast: Secondary | ICD-10-CM | POA: Diagnosis not present

## 2019-03-27 ENCOUNTER — Ambulatory Visit
Admission: RE | Admit: 2019-03-27 | Discharge: 2019-03-27 | Disposition: A | Discharge status: Home / Self Care | Payer: BC Managed Care – PPO | Source: Ambulatory Visit | Attending: Radiation Oncology | Admitting: Radiation Oncology

## 2019-03-27 ENCOUNTER — Other Ambulatory Visit: Payer: Self-pay

## 2019-03-27 DIAGNOSIS — D0511 Intraductal carcinoma in situ of right breast: Secondary | ICD-10-CM | POA: Diagnosis not present

## 2019-03-28 ENCOUNTER — Other Ambulatory Visit: Payer: Self-pay

## 2019-03-28 ENCOUNTER — Ambulatory Visit
Admission: RE | Admit: 2019-03-28 | Discharge: 2019-03-28 | Disposition: A | Discharge status: Home / Self Care | Payer: BC Managed Care – PPO | Source: Ambulatory Visit | Attending: Radiation Oncology | Admitting: Radiation Oncology

## 2019-03-28 DIAGNOSIS — D0511 Intraductal carcinoma in situ of right breast: Secondary | ICD-10-CM | POA: Diagnosis not present

## 2019-03-29 ENCOUNTER — Other Ambulatory Visit: Payer: Self-pay

## 2019-03-29 ENCOUNTER — Ambulatory Visit
Admission: RE | Admit: 2019-03-29 | Discharge: 2019-03-29 | Disposition: A | Discharge status: Home / Self Care | Payer: BC Managed Care – PPO | Source: Ambulatory Visit | Attending: Radiation Oncology | Admitting: Radiation Oncology

## 2019-03-29 DIAGNOSIS — D0511 Intraductal carcinoma in situ of right breast: Secondary | ICD-10-CM | POA: Diagnosis not present

## 2019-03-30 ENCOUNTER — Ambulatory Visit
Admission: RE | Admit: 2019-03-30 | Discharge: 2019-03-30 | Disposition: A | Discharge status: Home / Self Care | Payer: BC Managed Care – PPO | Source: Ambulatory Visit | Attending: Radiation Oncology | Admitting: Radiation Oncology

## 2019-03-30 ENCOUNTER — Other Ambulatory Visit: Payer: Self-pay

## 2019-03-30 DIAGNOSIS — D0511 Intraductal carcinoma in situ of right breast: Secondary | ICD-10-CM | POA: Diagnosis not present

## 2019-03-31 ENCOUNTER — Other Ambulatory Visit: Payer: Self-pay

## 2019-03-31 ENCOUNTER — Ambulatory Visit
Admission: RE | Admit: 2019-03-31 | Discharge: 2019-03-31 | Disposition: A | Discharge status: Home / Self Care | Payer: BC Managed Care – PPO | Source: Ambulatory Visit | Attending: Radiation Oncology | Admitting: Radiation Oncology

## 2019-03-31 DIAGNOSIS — D0511 Intraductal carcinoma in situ of right breast: Secondary | ICD-10-CM | POA: Diagnosis not present

## 2019-04-03 ENCOUNTER — Other Ambulatory Visit: Payer: Self-pay

## 2019-04-03 ENCOUNTER — Ambulatory Visit
Admission: RE | Admit: 2019-04-03 | Discharge: 2019-04-03 | Disposition: A | Discharge status: Home / Self Care | Payer: BC Managed Care – PPO | Source: Ambulatory Visit | Attending: Radiation Oncology | Admitting: Radiation Oncology

## 2019-04-03 DIAGNOSIS — Z170 Estrogen receptor positive status [ER+]: Secondary | ICD-10-CM | POA: Diagnosis not present

## 2019-04-03 DIAGNOSIS — Z510 Encounter for antineoplastic radiation therapy: Secondary | ICD-10-CM | POA: Diagnosis not present

## 2019-04-03 DIAGNOSIS — D0511 Intraductal carcinoma in situ of right breast: Secondary | ICD-10-CM | POA: Insufficient documentation

## 2019-04-04 ENCOUNTER — Other Ambulatory Visit: Payer: Self-pay

## 2019-04-04 ENCOUNTER — Ambulatory Visit
Admission: RE | Admit: 2019-04-04 | Discharge: 2019-04-04 | Disposition: A | Discharge status: Home / Self Care | Payer: BC Managed Care – PPO | Source: Ambulatory Visit | Attending: Radiation Oncology | Admitting: Radiation Oncology

## 2019-04-04 DIAGNOSIS — D0511 Intraductal carcinoma in situ of right breast: Secondary | ICD-10-CM | POA: Diagnosis not present

## 2019-04-05 ENCOUNTER — Other Ambulatory Visit: Payer: Self-pay

## 2019-04-05 ENCOUNTER — Ambulatory Visit
Admission: RE | Admit: 2019-04-05 | Discharge: 2019-04-05 | Disposition: A | Discharge status: Home / Self Care | Payer: BC Managed Care – PPO | Source: Ambulatory Visit | Attending: Radiation Oncology | Admitting: Radiation Oncology

## 2019-04-05 DIAGNOSIS — D0511 Intraductal carcinoma in situ of right breast: Secondary | ICD-10-CM | POA: Diagnosis not present

## 2019-04-06 ENCOUNTER — Other Ambulatory Visit: Payer: Self-pay

## 2019-04-06 ENCOUNTER — Ambulatory Visit
Admission: RE | Admit: 2019-04-06 | Discharge: 2019-04-06 | Disposition: A | Discharge status: Home / Self Care | Payer: BC Managed Care – PPO | Source: Ambulatory Visit | Attending: Radiation Oncology | Admitting: Radiation Oncology

## 2019-04-06 DIAGNOSIS — D0511 Intraductal carcinoma in situ of right breast: Secondary | ICD-10-CM | POA: Diagnosis not present

## 2019-04-07 ENCOUNTER — Other Ambulatory Visit: Payer: Self-pay

## 2019-04-07 ENCOUNTER — Ambulatory Visit
Admission: RE | Admit: 2019-04-07 | Discharge: 2019-04-07 | Disposition: A | Discharge status: Home / Self Care | Payer: BC Managed Care – PPO | Source: Ambulatory Visit | Attending: Radiation Oncology | Admitting: Radiation Oncology

## 2019-04-07 DIAGNOSIS — D0511 Intraductal carcinoma in situ of right breast: Secondary | ICD-10-CM | POA: Diagnosis not present

## 2019-04-09 NOTE — Progress Notes (Signed)
Patient Care Team: Renne Crigler, NP as PCP - General (Nurse Practitioner) Mauro Kaufmann, RN as Oncology Nurse Navigator Rockwell Germany, RN as Oncology Nurse Navigator Rolm Bookbinder, MD as Consulting Physician (General Surgery) Nicholas Lose, MD as Consulting Physician (Hematology and Oncology) Gery Pray, MD as Consulting Physician (Radiation Oncology)  DIAGNOSIS:    ICD-10-CM   1. Ductal carcinoma in situ (DCIS) of right breast  D05.11     SUMMARY OF ONCOLOGIC HISTORY: Oncology History  Ductal carcinoma in situ (DCIS) of right breast  01/25/2019 Initial Diagnosis   Patient had history of probably benign right breast calcifications. Mammogram showed two groups of right breast calcifications measuring 0.6cm and 0.3cm and a right breast mass measuring 0.7cm, with no axillary adenopathy. Biopsy on 01/23/19 showed DCIS with calcifications and focal necrosis at the lateral anterior position and lateral posterior position, high grade, ER+ 100%, PR+ 95%. And a benign lymph node representing the mass at the 9 o'clock position.   02/14/2019 Surgery   Right lumpectomy [name]): DCIS, intermediate grade, clear margins.    03/17/2019 -  Radiation Therapy   Adjuvant radiation therapy     CHIEF COMPLIANT: Follow-up of DCIS to discuss antiestrogen therapy  INTERVAL HISTORY: Kari Roberts is a 54 y.o. with above-mentioned history of right breast DCIS for which she underwent a lumpectomy and is currently on radiation therapy. She presents to the clinic today to discuss antiestrogen therapy.  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  MEDICATIONS:  Current Outpatient Medications  Medication Sig Dispense Refill  . oxyCODONE (OXY IR/ROXICODONE) 5 MG immediate release tablet Take 1-2 tablets (5-10 mg total) by mouth every 6 (six) hours as needed for moderate pain, severe  pain or breakthrough pain. (Patient not taking: Reported on 03/08/2019) 5 tablet 0   No current facility-administered medications for this visit.    PHYSICAL EXAMINATION: ECOG PERFORMANCE STATUS: 1 - Symptomatic but completely ambulatory  Vitals:   04/10/19 1513  BP: (!) 143/94  Pulse: 95  Resp: 18  Temp: 98 F (36.7 C)  SpO2: 100%   Filed Weights   04/10/19 1513  Weight: 137 lb 8 oz (62.4 kg)    LABORATORY DATA:  I have reviewed the data as listed CMP Latest Ref Rng & Units 02/01/2019  Glucose 70 - 99 mg/dL 129(H)  BUN 6 - 20 mg/dL 7  Creatinine 0.44 - 1.00 mg/dL 0.75  Sodium 135 - 145 mmol/L 140  Potassium 3.5 - 5.1 mmol/L 4.1  Chloride 98 - 111 mmol/L 104  CO2 22 - 32 mmol/L 24  Calcium 8.9 - 10.3 mg/dL 9.2  Total Protein 6.5 - 8.1 g/dL 7.7  Total Bilirubin 0.3 - 1.2 mg/dL 0.3  Alkaline Phos 38 - 126 U/L 96  AST 15 - 41 U/L 19  ALT 0 - 44 U/L 20    Lab Results  Component Value Date   WBC 6.0 02/01/2019   HGB 14.5 02/01/2019   HCT 43.7 02/01/2019   MCV 87.2 02/01/2019   PLT 256 02/01/2019   NEUTROABS 3.9 02/01/2019    ASSESSMENT & PLAN:  Ductal carcinoma in situ (DCIS) of right breast 02/14/2019: Right lumpectomy (Dr. 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 Adjuvant radiation therapy 03/17/2019-04/10/2019 2. Followed by antiestrogen therapy with tamoxifen 5 years  We  discussed the risks and benefits of tamoxifen. These include but not limited to insomnia, hot flashes, mood changes, vaginal dryness, and weight gain. Although rare, serious side effects including endometrial cancer, risk of blood clots were also discussed. We strongly believe that the benefits far outweigh the risks. Patient understands these risks and consented to starting treatment. Planned treatment duration is 5 years.  We counseled about participation in the antiestrogen therapy compliance study.  Return to  clinic in 3 months for survivorship care plan     No orders of the defined types were placed in this encounter.  The patient has a good understanding of the overall plan. she agrees with it. she will call with any problems that may develop before the next visit here.  Total time spent: 20 mins including face to face time and time spent for planning, charting and coordination of care  Nicholas Lose, MD 04/10/2019  I, Cloyde Reams Dorshimer, am acting as scribe for Dr. Nicholas Lose.  I have reviewed the above documentation for accuracy and completeness, and I agree with the above.

## 2019-04-10 ENCOUNTER — Telehealth: Payer: Self-pay | Admitting: Hematology and Oncology

## 2019-04-10 ENCOUNTER — Ambulatory Visit
Admission: RE | Admit: 2019-04-10 | Discharge: 2019-04-10 | Disposition: A | Discharge status: Home / Self Care | Payer: BC Managed Care – PPO | Source: Ambulatory Visit | Attending: Radiation Oncology | Admitting: Radiation Oncology

## 2019-04-10 ENCOUNTER — Other Ambulatory Visit: Payer: Self-pay

## 2019-04-10 ENCOUNTER — Inpatient Hospital Stay: Payer: BC Managed Care – PPO | Attending: Hematology and Oncology | Admitting: Hematology and Oncology

## 2019-04-10 DIAGNOSIS — Z170 Estrogen receptor positive status [ER+]: Secondary | ICD-10-CM | POA: Diagnosis not present

## 2019-04-10 DIAGNOSIS — D0511 Intraductal carcinoma in situ of right breast: Secondary | ICD-10-CM

## 2019-04-10 MED ORDER — TAMOXIFEN CITRATE 20 MG PO TABS: 20.0000 mg | ORAL_TABLET | Freq: Every day | ORAL | 3 refills | Status: DC

## 2019-04-10 NOTE — Telephone Encounter (Signed)
I left a message regarding schedule  

## 2019-04-10 NOTE — Assessment & Plan Note (Signed)
02/14/2019: Right lumpectomy (Dr. 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 Adjuvant radiation therapy 03/17/2019-04/10/2019 2. Followed by antiestrogen therapy with tamoxifen 5 years  We discussed the risks and benefits of tamoxifen. These include but not limited to insomnia, hot flashes, mood changes, vaginal dryness, and weight gain. Although rare, serious side effects including endometrial cancer, risk of blood clots were also discussed. We strongly believe that the benefits far outweigh the risks. Patient understands these risks and consented to starting treatment. Planned treatment duration is 5 years.  We counseled about participation in the antiestrogen therapy compliance study.  Return to clinic in 3 months for survivorship care plan

## 2019-04-11 ENCOUNTER — Other Ambulatory Visit: Payer: Self-pay

## 2019-04-11 ENCOUNTER — Ambulatory Visit
Admission: RE | Admit: 2019-04-11 | Discharge: 2019-04-11 | Disposition: A | Discharge status: Home / Self Care | Payer: BC Managed Care – PPO | Source: Ambulatory Visit | Attending: Radiation Oncology | Admitting: Radiation Oncology

## 2019-04-11 DIAGNOSIS — D0511 Intraductal carcinoma in situ of right breast: Secondary | ICD-10-CM | POA: Diagnosis not present

## 2019-04-12 ENCOUNTER — Encounter: Payer: Self-pay | Admitting: *Deleted

## 2019-04-12 ENCOUNTER — Ambulatory Visit
Admission: RE | Admit: 2019-04-12 | Discharge: 2019-04-12 | Disposition: A | Discharge status: Home / Self Care | Payer: BC Managed Care – PPO | Source: Ambulatory Visit | Attending: Radiation Oncology | Admitting: Radiation Oncology

## 2019-04-12 ENCOUNTER — Other Ambulatory Visit: Payer: Self-pay

## 2019-04-12 ENCOUNTER — Encounter: Payer: Self-pay | Admitting: Radiation Oncology

## 2019-04-12 DIAGNOSIS — D0511 Intraductal carcinoma in situ of right breast: Secondary | ICD-10-CM | POA: Diagnosis not present

## 2019-05-15 ENCOUNTER — Ambulatory Visit
Admission: RE | Admit: 2019-05-15 | Discharge: 2019-05-15 | Disposition: A | Discharge status: Home / Self Care | Payer: BC Managed Care – PPO | Source: Ambulatory Visit | Attending: Radiation Oncology | Admitting: Radiation Oncology

## 2019-05-15 ENCOUNTER — Encounter: Payer: Self-pay | Admitting: Radiation Oncology

## 2019-05-15 ENCOUNTER — Other Ambulatory Visit: Payer: Self-pay | Admitting: Adult Health

## 2019-05-15 ENCOUNTER — Other Ambulatory Visit: Payer: Self-pay

## 2019-05-15 VITALS — BP 134/85 | HR 81 | Temp 98.9°F | Resp 18 | Ht 63.0 in | Wt 137.4 lb

## 2019-05-15 DIAGNOSIS — Z79810 Long term (current) use of selective estrogen receptor modulators (SERMs): Secondary | ICD-10-CM | POA: Insufficient documentation

## 2019-05-15 DIAGNOSIS — Z170 Estrogen receptor positive status [ER+]: Secondary | ICD-10-CM | POA: Diagnosis not present

## 2019-05-15 DIAGNOSIS — Z923 Personal history of irradiation: Secondary | ICD-10-CM | POA: Diagnosis not present

## 2019-05-15 DIAGNOSIS — D0511 Intraductal carcinoma in situ of right breast: Secondary | ICD-10-CM | POA: Diagnosis not present

## 2019-05-15 NOTE — Progress Notes (Signed)
Received consent for my chart care companion AET study.  I put in order for surveys.    Wilber Bihari, NP

## 2019-05-15 NOTE — Patient Instructions (Signed)
Coronavirus (COVID-19) Are you at risk?  Are you at risk for the Coronavirus (COVID-19)?  To be considered HIGH RISK for Coronavirus (COVID-19), you have to meet the following criteria:  . Traveled to [place], [place], [place], [place] or [place]; or in the [place] to [place], [place], [place], or [place]; and have fever, cough, and shortness of breath within the last 2 weeks of travel OR . Been in close contact with a person diagnosed with COVID-19 within the last 2 weeks and have fever, cough, and shortness of breath . IF YOU DO NOT MEET THESE CRITERIA, YOU ARE CONSIDERED LOW RISK FOR COVID-19.  What to do if you are HIGH RISK for COVID-19?  . If you are having a medical emergency, call 911. . Seek medical care right away. Before you go to a doctor's office, urgent care or emergency department, call ahead and tell them about your recent travel, contact with someone diagnosed with COVID-19, and your symptoms. You should receive instructions from your physician's office regarding next steps of care.  . When you arrive at healthcare provider, tell the healthcare staff immediately you have returned from visiting [place], [place], [place], [place] or [place]; or traveled in the [place] to [place], [place], [place], or [place]; in the last two weeks or you have been in close contact with a person diagnosed with COVID-19 in the last 2 weeks.   . Tell the health care staff about your symptoms: fever, cough and shortness of breath. . After you have been seen by a medical provider, you will be either: o Tested for (COVID-19) and discharged home on quarantine except to seek medical care if symptoms worsen, and asked to  - Stay home and avoid contact with others until you get your results (4-5 days)  - Avoid travel on public transportation if possible (such as bus, train, or airplane) or o Sent to the Emergency Department by EMS for evaluation, COVID-19 testing, and possible  admission depending on your condition and test results.  What to do if you are LOW RISK for COVID-19?  Reduce your risk of any infection by using the same precautions used for avoiding the common cold or flu:  . Wash your hands often with soap and warm water for at least 20 seconds.  If soap and water are not readily available, use an alcohol-based hand sanitizer with at least 60% alcohol.  . If coughing or sneezing, cover your mouth and nose by coughing or sneezing into the elbow areas of your shirt or coat, into a tissue or into your sleeve (not your hands). . Avoid shaking hands with others and consider head nods or verbal greetings only. . Avoid touching your eyes, nose, or mouth with unwashed hands.  . Avoid close contact with people who are sick. . Avoid places or events with large numbers of people in one location, like concerts or sporting events. . Carefully consider travel plans you have or are making. . If you are planning any travel outside or inside the [place], visit the CDC's Travelers' Health webpage for the latest health notices. . If you have some symptoms but not all symptoms, continue to monitor at home and seek medical attention if your symptoms worsen. . If you are having a medical emergency, call 911.   ADDITIONAL HEALTHCARE OPTIONS FOR PATIENTS  [facility] / e-Visit: [email or web address]         [facility]: [phone]  [facility]: [phone]                   [facility]: [phone]   

## 2019-05-15 NOTE — Progress Notes (Signed)
  Radiation Oncology         ([phone]) [phone] ________________________________  Name: Kari Roberts MRN: [id number]  Date: 05/15/2019  DOB: December 30, 1965  Follow-Up Visit Note  CC: Renne Crigler, NP  Renne Crigler, NP    ICD-10-CM   1. Ductal carcinoma in situ (DCIS) of right breast  D05.11     Diagnosis: Stage0, RightBreastUOQ,DuctalCarcinomaDCIS, ER+/ PR+, Grade2  Interval Since Last Radiation: One month and two days  03/16/2019 - 04/12/2019; 40.05 Gy in 15 fractions using photons with a 3D technique towards the right breast.  Narrative:  The patient returns today for routine follow-up. The patient was last seen by Dr. Lindi Adie on 04/10/2019, during which time the patient began anti-estrogen therapy with Tamoxifen x5 years.  On review of systems, she reports occasional "prickly" sensation of right breast. She denies breast pain.  ALLERGIES:  is allergic to augmentin [amoxicillin-pot clavulanate].  Meds: Current Outpatient Medications  Medication Sig Dispense Refill  . tamoxifen (NOLVADEX) 20 MG tablet Take 1 tablet (20 mg total) by mouth daily. 90 tablet 3   No current facility-administered medications for this encounter.    Physical Findings: The patient is in no acute distress. Patient is alert and oriented.  height is 5\' 3"  (1.6 m) and weight is 137 lb 6 oz (62.3 kg). Her temporal temperature is 98.9 F (37.2 C). Her blood pressure is 134/85 and her pulse is 81. Her respiration is 18 and oxygen saturation is 100%.   No significant changes. Lungs are clear to auscultation bilaterally. Heart has regular rate and rhythm. No palpable cervical, supraclavicular, or axillary adenopathy. Abdomen soft, non-tender, normal bowel sounds. Left breast: No palpable mass, nipple discharge, or bleeding. Right breast: Skin is healed well.  Hyperpigmentation changes noted and some mild swelling.  No dominant mass appreciated breast nipple discharge or bleeding  Lab Findings: Lab  Results  Component Value Date   WBC 6.0 02/01/2019   HGB 14.5 02/01/2019   HCT 43.7 02/01/2019   MCV 87.2 02/01/2019   PLT 256 02/01/2019    Radiographic Findings: No results found.  Impression: Stage0, RightBreastUOQ,DuctalCarcinomaDCIS, ER+/ PR+, Grade2  The patient is recovering from the effects of radiation.  No evidence of recurrence on clinical exam.  Tolerating adjuvant hormonal therapy well  Plan: As needed follow-up in radiation oncology.  The patient will continue close follow-up with medical oncology and continue on adjuvant hormonal therapy.  ____________________________________   Blair Promise, PhD, MD  This document serves as a record of services personally performed by Gery Pray, MD. It was created on his behalf by Clerance Lav, a trained medical scribe. The creation of this record is based on the scribe's personal observations and the provider's statements to them. This document has been checked and approved by the attending provider.

## 2019-05-15 NOTE — Progress Notes (Signed)
Pt presents today for f/u with Dr. Sondra Come. Pt denies c/o pain. Pt reports that fatigue associated with XRT has resolved. Pt reports not needing any specific moisturizer on breast. Breast is only faintly hyperpigmented, mainly around nipple area. Pt reports breast feels "prickly" occasionally.   BP 134/85 (BP Location: Left Arm, Patient Position: Sitting)   Pulse 81   Temp 98.9 F (37.2 C) (Temporal)   Resp 18   Ht 5\' 3"  (1.6 m)   Wt 137 lb 6 oz (62.3 kg)   SpO2 100%   BMI 24.33 kg/m   Wt Readings from Last 3 Encounters:  05/15/19 137 lb 6 oz (62.3 kg)  04/10/19 137 lb 8 oz (62.4 kg)  03/08/19 137 lb 2 oz (62.2 kg)   Loma Sousa, RN BSN

## 2019-05-24 NOTE — Progress Notes (Signed)
  Patient Name: Kari Roberts MRN: [id number] DOB: [birth year] Referring Physician: Haynes Hoehn (Profile Not Attached) Date of Service: 04/12/2019 [facility], [place]                                                        End Of Treatment Note  Diagnoses: D05.11-Ductal carcinoma in situ (DCIS) of right  Cancer Staging: Stage0, RightBreastUOQ,DuctalCarcinomaDCIS, ER+/ PR+, Grade2  Intent: Curative  Radiation Treatment Dates: 03/16/2019 through 04/12/2019 Site Technique Total Dose (Gy) Dose per Fx (Gy) Completed Fx Beam Energies  Breast, Right: Breast_Rt_Bst 3D 10/10 2 5/5 6X, 10X  Breast, Right: Breast_Rt 3D 40.05/40.05 2.67 15/15 6X, 10X   Narrative: The patient tolerated radiation therapy relatively well. She did report an occasional burning sensation around the right nipple and some very mild fatigue. Towards the end of treatment, the right breast area was erythematous with hyperpigmentation changes. No skin breakdown was appreciated.  Plan: The patient will follow-up with radiation oncology in one month.  ________________________________________________   Blair Promise, PhD, MD  This document serves as a record of services personally performed by Gery Pray, MD. It was created on his behalf by Clerance Lav, a trained medical scribe. The creation of this record is based on the scribe's personal observations and the provider's statements to them. This document has been checked and approved by the attending provider.

## 2019-07-12 ENCOUNTER — Inpatient Hospital Stay: Payer: BC Managed Care – PPO | Attending: Hematology and Oncology | Admitting: Adult Health

## 2019-07-12 ENCOUNTER — Other Ambulatory Visit: Payer: Self-pay

## 2019-07-12 VITALS — BP 125/98 | HR 86 | Temp 98.9°F | Resp 18 | Ht 63.0 in | Wt 136.9 lb

## 2019-07-12 DIAGNOSIS — D0511 Intraductal carcinoma in situ of right breast: Secondary | ICD-10-CM

## 2019-07-12 NOTE — Progress Notes (Signed)
SURVIVORSHIP VISIT:    BRIEF ONCOLOGIC HISTORY:  Oncology History  Ductal carcinoma in situ (DCIS) of right breast  01/25/2019 Initial Diagnosis   Patient had history of probably benign right breast calcifications. Mammogram showed two groups of right breast calcifications measuring 0.6cm and 0.3cm and a right breast mass measuring 0.7cm, with no axillary adenopathy. Biopsy on 01/23/19 showed DCIS with calcifications and focal necrosis at the lateral anterior position and lateral posterior position, high grade, ER+ 100%, PR+ 95%. And a benign lymph node representing the mass at the 9 o'clock position.   02/14/2019 Surgery   Right lumpectomy Kari Roberts) [id number]): DCIS, intermediate grade, clear margins. No regional lymph nodes were examined.   03/16/2019 - 04/12/2019 Radiation Therapy   The patient initially received a dose of 40.05 Gy in 15 fractions to the breast using whole-breast tangent fields. This was delivered using a 3-D conformal technique. The pt received a boost delivering an additional 10 Gy in 5 fractions using a electron boost with 70meV electrons. The total dose was 50.05 Gy.   04/2019 - 04/2024 Anti-estrogen oral therapy   Tamoxifen     INTERVAL HISTORY:  Ms. Kari Roberts to review her survivorship care plan detailing her treatment course for breast cancer, as well as monitoring long-term side effects of that treatment, education regarding health maintenance, screening, and overall wellness and health promotion.     Overall, Ms. [name] reports feeling quite well.  She completed the survivorship survey and noted mild intermittent fullness in her breast, and occasional hot flashes at night.  She is taking tamoxifen daily and is doing well.    REVIEW OF SYSTEMS:  Review of Systems  Constitutional: Negative for appetite change, fatigue and unexpected weight change.  HENT:   Negative for hearing loss, lump/mass, sore throat and trouble swallowing.   Eyes: Negative for eye problems  and icterus.  Respiratory: Negative for chest tightness, cough and shortness of breath.   Cardiovascular: Negative for chest pain, leg swelling and palpitations.  Gastrointestinal: Negative for abdominal distention and abdominal pain.  Endocrine: Positive for hot flashes.  Genitourinary: Negative for difficulty urinating.   Musculoskeletal: Negative for arthralgias.  Skin: Negative for itching and rash.  Neurological: Negative for dizziness, headaches and numbness.  Hematological: Does not bruise/bleed easily.  Psychiatric/Behavioral: Negative for depression. The patient is not nervous/anxious.    Breast: Denies any new nodularity, masses, tenderness, nipple changes, or nipple discharge.      ONCOLOGY TREATMENT TEAM:  1. Surgeon:  Dr. Donne Roberts at [facility] 2. Medical Oncologist: Dr. Lindi Roberts  3. Radiation Oncologist: Dr. Sondra Roberts    PAST MEDICAL/SURGICAL HISTORY:  Past Medical History:  Diagnosis Date  . Cancer [facility])    recent breast CA diagnosis  . Family history of breast cancer    Past Surgical History:  Procedure Laterality Date  . BREAST LUMPECTOMY WITH RADIOACTIVE SEED LOCALIZATION Right 02/14/2019   Procedure: RIGHT BREAST LUMPECTOMY WITH BRACKETED RADIOACTIVE SEED LOCALIZATION;  Surgeon: Kari Bookbinder, MD;  Location: [facility];  Service: General;  Laterality: Right;     ALLERGIES:  Allergies  Allergen Reactions  . Augmentin [Amoxicillin-Pot Clavulanate] Hives     CURRENT MEDICATIONS:  Outpatient Encounter Medications as of 07/12/2019  Medication Sig  . tamoxifen (NOLVADEX) 20 MG tablet Take 1 tablet (20 mg total) by mouth daily.   No facility-administered encounter medications on file as of 07/12/2019.     ONCOLOGIC FAMILY HISTORY:  Family History  Problem Relation Age of Onset  . Lung  cancer Father 8  . Breast cancer Sister 53  . Stomach cancer Maternal Uncle 26     GENETIC COUNSELING/TESTING: Not at this  time  SOCIAL HISTORY:  Social History   Socioeconomic History  . Marital status: Married    Spouse name: Kari Roberts  . Number of children: Not on file  . Years of education: Not on file  . Highest education level: Not on file  Occupational History  . Not on file  Tobacco Use  . Smoking status: Never Smoker  . Smokeless tobacco: Never Used  Substance and Sexual Activity  . Alcohol use: Never  . Drug use: Never  . Sexual activity: Not on file  Other Topics Concern  . Not on file  Social History Narrative  . Not on file   Social Determinants of Health   Financial Resource Strain:   . Difficulty of Paying Living Expenses:   Food Insecurity:   . Worried About Charity fundraiser in the Last Year:   . Arboriculturist in the Last Year:   Transportation Needs:   . Film/video editor (Medical):   Marland Kitchen Lack of Transportation (Non-Medical):   Physical Activity:   . Days of Exercise per Week:   . Minutes of Exercise per Session:   Stress:   . Feeling of Stress :   Social Connections:   . Frequency of Communication with Friends and Family:   . Frequency of Social Gatherings with Friends and Family:   . Attends Religious Services:   . Active Member of Clubs or Organizations:   . Attends Archivist Meetings:   Marland Kitchen Marital Status:   Intimate Partner Violence:   . Fear of Current or Ex-Partner:   . Emotionally Abused:   Marland Kitchen Physically Abused:   . Sexually Abused:      OBSERVATIONS/OBJECTIVE:  BP (!) 125/98 (BP Location: Left Arm, Patient Position: Sitting)   Pulse 86   Temp 98.9 F (37.2 C)   Resp 18   Ht 5\' 3"  (1.6 m)   Wt 136 lb 14.4 oz (62.1 kg)   SpO2 100%   BMI 24.25 kg/m  GENERAL: Patient is a well appearing female in no acute distress HEENT:  Sclerae anicteric.  Oropharynx clear and moist. No ulcerations or evidence of oropharyngeal candidiasis. Neck is supple.  NODES:  No cervical, supraclavicular, or axillary lymphadenopathy palpated.  BREAST EXAM:   Deferred. LUNGS:  Clear to auscultation bilaterally.  No wheezes or rhonchi. HEART:  Regular rate and rhythm. No murmur appreciated. ABDOMEN:  Soft, nontender.  Positive, normoactive bowel sounds. No organomegaly palpated. MSK:  No focal spinal tenderness to palpation. Full range of motion bilaterally in the upper extremities. EXTREMITIES:  No peripheral edema.   SKIN:  Clear with no obvious rashes or skin changes. No nail dyscrasia. NEURO:  Nonfocal. Well oriented.  Appropriate affect.   LABORATORY DATA:  None for this visit.  DIAGNOSTIC IMAGING:  None for this visit.      ASSESSMENT AND PLAN:  Ms.. Kari Roberts is a pleasant 54 y.o. female with Stage 0 right breast DCIS, ER+/PR+, diagnosed in 01/2019, treated with lumpectomy, adjuvant radiation therapy, and anti-estrogen therapy with Tamoxifen beginning in 04/2019.  She presents to the Survivorship Clinic for our initial meeting and routine follow-up post-completion of treatment for breast cancer.    1. Stage 0 right breast cancer:  Ms. Hogan is continuing to recover from definitive treatment for breast cancer. She will follow-up with her medical oncologist,  Dr. Lindi Roberts in 3 months with history and physical exam per surveillance protocol.  She will continue her anti-estrogen therapy with Tamoxifen. Thus far, she is tolerating the Tamoxifen well, with minimal side effects. She was instructed to make Dr. Lindi Roberts or myself aware if she begins to experience any worsening side effects of the medication and I could see her back in clinic to help manage those side effects, as needed. Her mammogram is due 01/2020; orders placed today. Today, a comprehensive survivorship care plan and treatment summary was reviewed with the patient today detailing her breast cancer diagnosis, treatment course, potential late/long-term effects of treatment, appropriate follow-up care with recommendations for the future, and patient education resources.  A copy of this summary,  along with a letter will be sent to the patient's primary care provider via mail/fax/In Basket message after today's visit.    2. Bone health:  She was given education on specific activities to promote bone health.  3. Cancer screening:  Due to Ms. Segar's history and her age, she should receive screening for skin cancers, colon cancer, and gynecologic cancers.  The information and recommendations are listed on the patient's comprehensive care plan/treatment summary and were reviewed in detail with the patient.    4. Health maintenance and wellness promotion: Ms. Massing was encouraged to consume 5-7 servings of fruits and vegetables per day. We reviewed the "Nutrition Rainbow" handout, as well as the handout "Take Control of Your Health and Reduce Your Cancer Risk" from the [employer].  She was also encouraged to engage in moderate to vigorous exercise for 30 minutes per day most days of the week. We discussed the [employer] fitness program, which is designed for cancer survivors to help them become more physically fit after cancer treatments.  She was instructed to limit her alcohol consumption and continue to abstain from tobacco use.     5. Support services/counseling: It is not uncommon for this period of the patient's cancer care trajectory to be one of many emotions and stressors.  We discussed how this can be increasingly difficult during the times of quarantine and social distancing due to the COVID-19 pandemic.   She was given information regarding our available services and encouraged to contact me with any questions or for help enrolling in any of our support group/programs.    Follow up instructions:    -Return to cancer center in 3 months for f/u with Dr. Lindi Roberts  -Mammogram due in 01/2020 -She is welcome to return back to the Survivorship Clinic at any time; no additional follow-up needed at this time.  -Consider referral back to survivorship as a long-term survivor for  continued surveillance  The patient was provided an opportunity to ask questions and all were answered. The patient agreed with the plan and demonstrated an understanding of the instructions.   Total encounter time: 45 minutes*   Wilber Bihari, NP 07/12/19 8:05 AM Medical Oncology and H[facility] [place], [place] [address] Tel. [phone]    Fax. [phone]  *Total Encounter Time as defined by the Centers for Medicare and Medicaid Services includes, in addition to the face-to-face time of a patient visit (documented in the note above) non-face-to-face time: obtaining and reviewing outside history, ordering and reviewing medications, tests or procedures, care coordination (communications with other health care professionals or caregivers) and documentation in the medical record.

## 2019-07-13 ENCOUNTER — Telehealth: Payer: Self-pay | Admitting: Adult Health

## 2019-07-13 NOTE — Telephone Encounter (Signed)
Scheduled appts per 6/9 los. Left voicemail with appt date and time.

## 2019-08-07 ENCOUNTER — Encounter (INDEPENDENT_AMBULATORY_CARE_PROVIDER_SITE_OTHER): Payer: Self-pay

## 2019-08-12 ENCOUNTER — Encounter (INDEPENDENT_AMBULATORY_CARE_PROVIDER_SITE_OTHER): Payer: Self-pay

## 2019-08-14 ENCOUNTER — Encounter (INDEPENDENT_AMBULATORY_CARE_PROVIDER_SITE_OTHER): Payer: Self-pay

## 2019-08-21 ENCOUNTER — Encounter (INDEPENDENT_AMBULATORY_CARE_PROVIDER_SITE_OTHER): Payer: Self-pay

## 2019-08-28 ENCOUNTER — Encounter (INDEPENDENT_AMBULATORY_CARE_PROVIDER_SITE_OTHER): Payer: Self-pay

## 2019-09-04 ENCOUNTER — Encounter (INDEPENDENT_AMBULATORY_CARE_PROVIDER_SITE_OTHER): Payer: Self-pay

## 2019-09-11 ENCOUNTER — Encounter (INDEPENDENT_AMBULATORY_CARE_PROVIDER_SITE_OTHER): Payer: Self-pay

## 2019-09-18 ENCOUNTER — Encounter (INDEPENDENT_AMBULATORY_CARE_PROVIDER_SITE_OTHER): Payer: Self-pay

## 2019-09-25 ENCOUNTER — Encounter (INDEPENDENT_AMBULATORY_CARE_PROVIDER_SITE_OTHER): Payer: Self-pay

## 2019-10-02 ENCOUNTER — Encounter: Payer: Self-pay | Admitting: Radiation Oncology

## 2019-10-02 ENCOUNTER — Other Ambulatory Visit: Payer: Self-pay

## 2019-10-02 ENCOUNTER — Encounter (INDEPENDENT_AMBULATORY_CARE_PROVIDER_SITE_OTHER): Payer: Self-pay

## 2019-10-02 ENCOUNTER — Ambulatory Visit
Admission: RE | Admit: 2019-10-02 | Discharge: 2019-10-02 | Disposition: A | Discharge status: Home / Self Care | Payer: BC Managed Care – PPO | Source: Ambulatory Visit | Attending: Radiation Oncology | Admitting: Radiation Oncology

## 2019-10-02 VITALS — BP 136/93 | HR 85 | Temp 97.6°F | Resp 20 | Ht 64.0 in | Wt 139.6 lb

## 2019-10-02 DIAGNOSIS — Z170 Estrogen receptor positive status [ER+]: Secondary | ICD-10-CM | POA: Diagnosis not present

## 2019-10-02 DIAGNOSIS — Z923 Personal history of irradiation: Secondary | ICD-10-CM | POA: Diagnosis not present

## 2019-10-02 DIAGNOSIS — R232 Flushing: Secondary | ICD-10-CM | POA: Insufficient documentation

## 2019-10-02 DIAGNOSIS — Z79810 Long term (current) use of selective estrogen receptor modulators (SERMs): Secondary | ICD-10-CM | POA: Insufficient documentation

## 2019-10-02 DIAGNOSIS — D0511 Intraductal carcinoma in situ of right breast: Secondary | ICD-10-CM | POA: Diagnosis present

## 2019-10-02 NOTE — Progress Notes (Signed)
°  Radiation Oncology         [phone] ________________________________  Name: Kari Roberts MRN: [id number]  Date: 10/02/2019  DOB: [birth year]  Follow-Up Visit Note  CC: Renne Crigler, NP  Renne Crigler, NP    ICD-10-CM   1. Ductal carcinoma in situ (DCIS) of right breast  D05.11     Diagnosis: Stage0, RightBreastUOQ,DuctalCarcinomaDCIS, ER+/ PR+, Grade2  Interval Since Last Radiation: Five months, two weeks, and six days.  Radiation Treatment Dates: 03/16/2019 through 04/12/2019 Site Technique Total Dose (Gy) Dose per Fx (Gy) Completed Fx Beam Energies  Breast, Right: Breast_Rt_Bst 3D 10/10 2 5/5 6X, 10X  Breast, Right: Breast_Rt 3D 40.05/40.05 2.67 15/15 6X, 10X   Narrative:  The patient returns today for routine follow-up. Since her last visit, she was seen by Wilber Bihari, NP, on 07/12/2019 at the survivorship clinic. She continues anti-estrogen oral therapy with Tamoxifen and remains under continued surveillance.  On review of systems, she reports no complaints. She denies breast pain, lymphedema, and difficulty with range of motion of right upper extremity.  She does have some hot flashes with tamoxifen but these issues are manageable.  She denies any nipple discharge or bleeding.  She occasionally will have some mild itching within the breast area  ALLERGIES:  is allergic to augmentin [amoxicillin-pot clavulanate].  Meds: Current Outpatient Medications  Medication Sig Dispense Refill   tamoxifen (NOLVADEX) 20 MG tablet Take 1 tablet (20 mg total) by mouth daily. 90 tablet 3   No current facility-administered medications for this encounter.    Physical Findings: The patient is in no acute distress. Patient is alert and oriented.  height is 5\' 4"  (1.626 m) and weight is 139 lb 9.6 oz (63.3 kg). Her temperature is 97.6 F (36.4 C). Her blood pressure is 136/93 (abnormal) and her pulse is 85. Her respiration is 20 and oxygen saturation is 100%.    Lungs  are clear to auscultation bilaterally. Heart has regular rate and rhythm. No palpable cervical, supraclavicular, or axillary adenopathy. Abdomen soft, non-tender, normal bowel sounds. Left breast: No palpable mass, nipple discharge, or bleeding. Right breast: Skin is well-healed.  Mild hyperpigmentation changes noted.  Very mild edema noted in the breast.  No dominant mass appreciated breast nipple discharge or bleeding  Lab Findings: Lab Results  Component Value Date   WBC 6.0 02/01/2019   HGB 14.5 02/01/2019   HCT 43.7 02/01/2019   MCV 87.2 02/01/2019   PLT 256 02/01/2019    Radiographic Findings: No results found.  Impression: Stage0, RightBreastUOQ,DuctalCarcinomaDCIS, ER+/ PR+, Grade2  No evidence of recurrence on clinical exam today. She is tolerating adjuvant hormonal therapy well.  Plan: The patient is scheduled to follow-up with Dr. Lindi Adie on 10/12/2019. She will have a mammogram in December. She will follow-up with radiation oncology as needed.     ____________________________________   Blair Promise, PhD, MD  This document serves as a record of services personally performed by Gery Pray, MD. It was created on his behalf by Clerance Lav, a trained medical scribe. The creation of this record is based on the scribe's personal observations and the provider's statements to them. This document has been checked and approved by the attending provider.

## 2019-10-02 NOTE — Progress Notes (Signed)
Patient here for a f/u visit with Dr. Sondra Come. Patient denies pain, problems with ROM or lymphedema.  BP (!) 136/93 (BP Location: Right Arm, Patient Position: Sitting, Cuff Size: Normal)   Pulse 85   Temp 97.6 F (36.4 C)   Resp 20   Ht 5\' 4"  (1.626 m)   Wt 139 lb 9.6 oz (63.3 kg)   SpO2 100%   BMI 23.96 kg/m   Wt Readings from Last 3 Encounters:  10/02/19 139 lb 9.6 oz (63.3 kg)  07/12/19 136 lb 14.4 oz (62.1 kg)  05/15/19 137 lb 6 oz (62.3 kg)

## 2019-10-09 ENCOUNTER — Encounter (INDEPENDENT_AMBULATORY_CARE_PROVIDER_SITE_OTHER): Payer: Self-pay

## 2019-10-11 NOTE — Progress Notes (Signed)
Patient Care Team: Renne Crigler, NP as PCP - General (Nurse Practitioner) Mauro Kaufmann, RN as Oncology Nurse Navigator Rockwell Germany, RN as Oncology Nurse Navigator Rolm Bookbinder, MD as Consulting Physician (General Surgery) Nicholas Lose, MD as Consulting Physician (Hematology and Oncology) Gery Pray, MD as Consulting Physician (Radiation Oncology)  DIAGNOSIS:    ICD-10-CM   1. Ductal carcinoma in situ (DCIS) of right breast  D05.11     SUMMARY OF ONCOLOGIC HISTORY: Oncology History  Ductal carcinoma in situ (DCIS) of right breast  01/25/2019 Initial Diagnosis   Patient had history of probably benign right breast calcifications. Mammogram showed two groups of right breast calcifications measuring 0.6cm and 0.3cm and a right breast mass measuring 0.7cm, with no axillary adenopathy. Biopsy on 01/23/19 showed DCIS with calcifications and focal necrosis at the lateral anterior position and lateral posterior position, high grade, ER+ 100%, PR+ 95%. And a benign lymph node representing the mass at the 9 o'clock position.   02/14/2019 Surgery   Right lumpectomy [name]) [phone]): DCIS, intermediate grade, clear margins. No regional lymph nodes were examined.   03/16/2019 - 04/12/2019 Radiation Therapy   The patient initially received a dose of 40.05 Gy in 15 fractions to the breast using whole-breast tangent fields. This was delivered using a 3-D conformal technique. The pt received a boost delivering an additional 10 Gy in 5 fractions using a electron boost with 41meV electrons. The total dose was 50.05 Gy.   04/2019 - 04/2024 Anti-estrogen oral therapy   Tamoxifen     CHIEF COMPLIANT: Follow-up of right breast DCIS on tamoxifen  INTERVAL HISTORY: Kari Roberts is a 54 y.o. with above-mentioned history of right breast DCIS for which she underwent a lumpectomy, radiation, and is currently on antiestrogen therapy with tamoxifen. She presents to the clinic today  for follow-up.  She is tolerating tamoxifen fairly well with exception of  hot flashes which she is able to manage.  ALLERGIES:  is allergic to augmentin [amoxicillin-pot clavulanate].  MEDICATIONS:  Current Outpatient Medications  Medication Sig Dispense Refill   tamoxifen (NOLVADEX) 20 MG tablet Take 1 tablet (20 mg total) by mouth daily. 90 tablet 3   No current facility-administered medications for this visit.    PHYSICAL EXAMINATION: ECOG PERFORMANCE STATUS: 1 - Symptomatic but completely ambulatory  Vitals:   10/12/19 1534  BP: 120/85  Pulse: 84  Resp: 17  Temp: 98.4 F (36.9 C)  SpO2: 100%   Filed Weights   10/12/19 1534  Weight: 139 lb (63 kg)      LABORATORY DATA:  I have reviewed the data as listed CMP Latest Ref Rng & Units 02/01/2019  Glucose 70 - 99 mg/dL 129(H)  BUN 6 - 20 mg/dL 7  Creatinine 0.44 - 1.00 mg/dL 0.75  Sodium 135 - 145 mmol/L 140  Potassium 3.5 - 5.1 mmol/L 4.1  Chloride 98 - 111 mmol/L 104  CO2 22 - 32 mmol/L 24  Calcium 8.9 - 10.3 mg/dL 9.2  Total Protein 6.5 - 8.1 g/dL 7.7  Total Bilirubin 0.3 - 1.2 mg/dL 0.3  Alkaline Phos 38 - 126 U/L 96  AST 15 - 41 U/L 19  ALT 0 - 44 U/L 20    Lab Results  Component Value Date   WBC 6.0 02/01/2019   HGB 14.5 02/01/2019   HCT 43.7 02/01/2019   MCV 87.2 02/01/2019   PLT 256 02/01/2019   NEUTROABS 3.9 02/01/2019    ASSESSMENT & PLAN:  Ductal carcinoma  in situ (DCIS) of right breast 02/14/2019: Right lumpectomy (Dr. 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 Adjuvant radiation therapy 03/17/2019-04/10/2019 2. Followed by antiestrogen therapy with tamoxifen 5 years started 04/10/2019  Tamoxifen toxicities: Hot flashes otherwise tolerating it fairly well  Breast cancer surveillance: 1.  Mammogram will need to be arranged for December 2021 2. breast exam 10/12/2019: Benign  Return to clinic in 1 year for  follow-up    No orders of the defined types were placed in this encounter.  The patient has a good understanding of the overall plan. she agrees with it. she will call with any problems that may develop before the next visit here.  Total time spent: 20 mins including face to face time and time spent for planning, charting and coordination of care  Nicholas Lose, MD 10/12/2019  I, Cloyde Reams Dorshimer, am acting as scribe for Dr. Nicholas Lose.  I have reviewed the above documentation for accuracy and completeness, and I agree with the above.

## 2019-10-12 ENCOUNTER — Inpatient Hospital Stay: Payer: BC Managed Care – PPO | Attending: Hematology and Oncology | Admitting: Hematology and Oncology

## 2019-10-12 ENCOUNTER — Other Ambulatory Visit: Payer: Self-pay

## 2019-10-12 DIAGNOSIS — Z170 Estrogen receptor positive status [ER+]: Secondary | ICD-10-CM | POA: Diagnosis not present

## 2019-10-12 DIAGNOSIS — D0511 Intraductal carcinoma in situ of right breast: Secondary | ICD-10-CM | POA: Insufficient documentation

## 2019-10-12 DIAGNOSIS — Z923 Personal history of irradiation: Secondary | ICD-10-CM | POA: Diagnosis not present

## 2019-10-12 DIAGNOSIS — Z79810 Long term (current) use of selective estrogen receptor modulators (SERMs): Secondary | ICD-10-CM | POA: Diagnosis not present

## 2019-10-12 DIAGNOSIS — R232 Flushing: Secondary | ICD-10-CM | POA: Insufficient documentation

## 2019-10-12 NOTE — Assessment & Plan Note (Signed)
02/14/2019: Right lumpectomy (Dr. 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 Adjuvant radiation therapy 03/17/2019-04/10/2019 2. Followed by antiestrogen therapy with tamoxifen 5 years started 04/10/2019  Tamoxifen toxicities:  Breast cancer surveillance: 1.  Mammogram will need to be arranged for December 2021 2. breast exam 10/12/2019: Benign  Return to clinic in 1 year for follow-up

## 2019-10-30 ENCOUNTER — Encounter (INDEPENDENT_AMBULATORY_CARE_PROVIDER_SITE_OTHER): Payer: Self-pay

## 2019-11-06 ENCOUNTER — Encounter (INDEPENDENT_AMBULATORY_CARE_PROVIDER_SITE_OTHER): Payer: Self-pay

## 2019-11-08 ENCOUNTER — Encounter (INDEPENDENT_AMBULATORY_CARE_PROVIDER_SITE_OTHER): Payer: Self-pay

## 2019-11-13 ENCOUNTER — Encounter (INDEPENDENT_AMBULATORY_CARE_PROVIDER_SITE_OTHER): Payer: Self-pay

## 2019-11-20 ENCOUNTER — Encounter (INDEPENDENT_AMBULATORY_CARE_PROVIDER_SITE_OTHER): Payer: Self-pay

## 2019-11-27 ENCOUNTER — Encounter (INDEPENDENT_AMBULATORY_CARE_PROVIDER_SITE_OTHER): Payer: Self-pay

## 2019-12-04 ENCOUNTER — Encounter (INDEPENDENT_AMBULATORY_CARE_PROVIDER_SITE_OTHER): Payer: Self-pay

## 2019-12-11 ENCOUNTER — Encounter (INDEPENDENT_AMBULATORY_CARE_PROVIDER_SITE_OTHER): Payer: Self-pay

## 2019-12-18 ENCOUNTER — Encounter (INDEPENDENT_AMBULATORY_CARE_PROVIDER_SITE_OTHER): Payer: Self-pay

## 2019-12-25 ENCOUNTER — Encounter (INDEPENDENT_AMBULATORY_CARE_PROVIDER_SITE_OTHER): Payer: Self-pay

## 2020-01-08 ENCOUNTER — Encounter (INDEPENDENT_AMBULATORY_CARE_PROVIDER_SITE_OTHER): Payer: Self-pay

## 2020-01-15 ENCOUNTER — Encounter (INDEPENDENT_AMBULATORY_CARE_PROVIDER_SITE_OTHER): Payer: Self-pay

## 2020-01-18 ENCOUNTER — Ambulatory Visit
Admission: RE | Admit: 2020-01-18 | Discharge: 2020-01-18 | Disposition: A | Discharge status: Home / Self Care | Payer: BC Managed Care – PPO | Source: Ambulatory Visit | Attending: Hematology and Oncology | Admitting: Hematology and Oncology

## 2020-01-18 ENCOUNTER — Other Ambulatory Visit: Payer: Self-pay

## 2020-01-18 DIAGNOSIS — D0511 Intraductal carcinoma in situ of right breast: Secondary | ICD-10-CM

## 2020-01-18 HISTORY — DX: Personal history of irradiation: Z92.3

## 2020-01-22 ENCOUNTER — Encounter (INDEPENDENT_AMBULATORY_CARE_PROVIDER_SITE_OTHER): Payer: Self-pay

## 2020-01-29 ENCOUNTER — Encounter (INDEPENDENT_AMBULATORY_CARE_PROVIDER_SITE_OTHER): Payer: Self-pay

## 2020-02-06 ENCOUNTER — Encounter (INDEPENDENT_AMBULATORY_CARE_PROVIDER_SITE_OTHER): Payer: Self-pay

## 2020-02-12 ENCOUNTER — Encounter (INDEPENDENT_AMBULATORY_CARE_PROVIDER_SITE_OTHER): Payer: Self-pay

## 2020-02-26 ENCOUNTER — Encounter (INDEPENDENT_AMBULATORY_CARE_PROVIDER_SITE_OTHER): Payer: Self-pay

## 2020-03-04 ENCOUNTER — Encounter (INDEPENDENT_AMBULATORY_CARE_PROVIDER_SITE_OTHER): Payer: Self-pay

## 2020-03-11 ENCOUNTER — Encounter (INDEPENDENT_AMBULATORY_CARE_PROVIDER_SITE_OTHER): Payer: Self-pay

## 2020-03-18 ENCOUNTER — Encounter (INDEPENDENT_AMBULATORY_CARE_PROVIDER_SITE_OTHER): Payer: Self-pay

## 2020-04-01 ENCOUNTER — Encounter (INDEPENDENT_AMBULATORY_CARE_PROVIDER_SITE_OTHER): Payer: Self-pay

## 2020-04-07 ENCOUNTER — Other Ambulatory Visit: Payer: Self-pay | Admitting: Hematology and Oncology

## 2020-04-08 ENCOUNTER — Encounter (INDEPENDENT_AMBULATORY_CARE_PROVIDER_SITE_OTHER): Payer: Self-pay

## 2020-04-15 ENCOUNTER — Encounter (INDEPENDENT_AMBULATORY_CARE_PROVIDER_SITE_OTHER): Payer: Self-pay

## 2020-04-22 ENCOUNTER — Encounter (INDEPENDENT_AMBULATORY_CARE_PROVIDER_SITE_OTHER): Payer: Self-pay

## 2020-04-29 ENCOUNTER — Encounter (INDEPENDENT_AMBULATORY_CARE_PROVIDER_SITE_OTHER): Payer: Self-pay

## 2020-05-06 ENCOUNTER — Encounter (INDEPENDENT_AMBULATORY_CARE_PROVIDER_SITE_OTHER): Payer: Self-pay

## 2020-05-13 ENCOUNTER — Encounter (INDEPENDENT_AMBULATORY_CARE_PROVIDER_SITE_OTHER): Payer: Self-pay

## 2020-07-05 ENCOUNTER — Other Ambulatory Visit: Payer: Self-pay | Admitting: Hematology and Oncology

## 2020-09-30 ENCOUNTER — Other Ambulatory Visit: Payer: Self-pay | Admitting: Hematology and Oncology

## 2020-09-30 DIAGNOSIS — Z9889 Other specified postprocedural states: Secondary | ICD-10-CM

## 2020-10-02 ENCOUNTER — Other Ambulatory Visit: Payer: Self-pay | Admitting: Hematology and Oncology

## 2020-10-09 NOTE — Progress Notes (Signed)
Patient Care Team: Renne Crigler, NP as PCP - General (Nurse Practitioner) Mauro Kaufmann, RN as Oncology Nurse Navigator Rockwell Germany, RN as Oncology Nurse Navigator Rolm Bookbinder, MD as Consulting Physician (General Surgery) Nicholas Lose, MD as Consulting Physician (Hematology and Oncology) Gery Pray, MD as Consulting Physician (Radiation Oncology)  DIAGNOSIS:    ICD-10-CM   1. Ductal carcinoma in situ (DCIS) of right breast  D05.11       SUMMARY OF ONCOLOGIC HISTORY: Oncology History  Ductal carcinoma in situ (DCIS) of right breast  01/25/2019 Initial Diagnosis   Patient had history of probably benign right breast calcifications. Mammogram showed two groups of right breast calcifications measuring 0.6cm and 0.3cm and a right breast mass measuring 0.7cm, with no axillary adenopathy. Biopsy on 01/23/19 showed DCIS with calcifications and focal necrosis at the lateral anterior position and lateral posterior position, high grade, ER+ 100%, PR+ 95%. And a benign lymph node representing the mass at the 9 o'clock position.   02/14/2019 Surgery   Right lumpectomy Kari Roberts) [phone]): DCIS, intermediate grade, clear margins. No regional lymph nodes were examined.   03/16/2019 - 04/12/2019 Radiation Therapy   The patient initially received a dose of 40.05 Gy in 15 fractions to the breast using whole-breast tangent fields. This was delivered using a 3-D conformal technique. The pt received a boost delivering an additional 10 Gy in 5 fractions using a electron boost with 106mV electrons. The total dose was 50.05 Gy.   04/2019 - 04/2024 Anti-estrogen oral therapy   Tamoxifen     CHIEF COMPLIANT: Follow-up of right breast DCIS on tamoxifen  INTERVAL HISTORY: Kari Gabyis a 55y.o. with above-mentioned history of right breast DCIS for which she underwent a lumpectomy, radiation, and is currently on antiestrogen therapy with tamoxifen. Mammogram on 01/18/2020 showed  no evidence of malignancy. She presents to the clinic today for follow-up.  She is tolerating tamoxifen extremely well without any problems or concerns.  ALLERGIES:  is allergic to augmentin [amoxicillin-pot clavulanate].  MEDICATIONS:  Current Outpatient Medications  Medication Sig Dispense Refill   tamoxifen (NOLVADEX) 20 MG tablet TAKE 1 TABLET BY MOUTH EVERY DAY 90 tablet 0   No current facility-administered medications for this visit.    PHYSICAL EXAMINATION: ECOG PERFORMANCE STATUS: 1 - Symptomatic but completely ambulatory  Vitals:   10/10/20 1508  BP: 129/70  Pulse: 73  Resp: 18  Temp: 97.9 F (36.6 C)  SpO2: 100%   Filed Weights   10/10/20 1508  Weight: 144 lb (65.3 kg)    BREAST: No palpable masses or nodules in either right or left breasts. No palpable axillary supraclavicular or infraclavicular adenopathy no breast tenderness or nipple discharge. (exam performed in the presence of a chaperone)  LABORATORY DATA:  I have reviewed the data as listed CMP Latest Ref Rng & Units 02/01/2019  Glucose 70 - 99 mg/dL 129(H)  BUN 6 - 20 mg/dL 7  Creatinine 0.44 - 1.00 mg/dL 0.75  Sodium 135 - 145 mmol/L 140  Potassium 3.5 - 5.1 mmol/L 4.1  Chloride 98 - 111 mmol/L 104  CO2 22 - 32 mmol/L 24  Calcium 8.9 - 10.3 mg/dL 9.2  Total Protein 6.5 - 8.1 g/dL 7.7  Total Bilirubin 0.3 - 1.2 mg/dL 0.3  Alkaline Phos 38 - 126 U/L 96  AST 15 - 41 U/L 19  ALT 0 - 44 U/L 20    Lab Results  Component Value Date   WBC 6.0 02/01/2019  HGB 14.5 02/01/2019   HCT 43.7 02/01/2019   MCV 87.2 02/01/2019   PLT 256 02/01/2019   NEUTROABS 3.9 02/01/2019    ASSESSMENT & PLAN:  Ductal carcinoma in situ (DCIS) of right breast 02/14/2019: Right lumpectomy (Dr. Donne Roberts): Intermediate grade focus of DCIS 0.2 cm, margins negative, ER 100%, PR 90% Tis NX stage 0   Treatment plan: 1. Adjuvant radiation therapy 03/17/2019-04/10/2019 2. Followed by antiestrogen therapy with tamoxifen 5  years started 04/10/2019   Tamoxifen toxicities: Hot flashes otherwise tolerating it fairly well   Breast cancer surveillance: 1.  Mammogram 01/18/2020: Benign, breast density category C 2. breast exam 10/10/2020: Benign   Return to clinic in 1 year for follow-up    No orders of the defined types were placed in this encounter.  The patient has a good understanding of the overall plan. she agrees with it. she will call with any problems that may develop before the next visit here.  Total time spent: 20 mins including face to face time and time spent for planning, charting and coordination of care  Rulon Eisenmenger, MD, MPH 10/10/2020  I, Thana Ates, am acting as scribe for Dr. Nicholas Lose.  I have reviewed the above documentation for accuracy and completeness, and I agree with the above.

## 2020-10-10 ENCOUNTER — Other Ambulatory Visit: Payer: Self-pay

## 2020-10-10 ENCOUNTER — Inpatient Hospital Stay: Payer: BC Managed Care – PPO | Attending: Hematology and Oncology | Admitting: Hematology and Oncology

## 2020-10-10 DIAGNOSIS — Z923 Personal history of irradiation: Secondary | ICD-10-CM | POA: Insufficient documentation

## 2020-10-10 DIAGNOSIS — Z79810 Long term (current) use of selective estrogen receptor modulators (SERMs): Secondary | ICD-10-CM | POA: Insufficient documentation

## 2020-10-10 DIAGNOSIS — Z170 Estrogen receptor positive status [ER+]: Secondary | ICD-10-CM | POA: Insufficient documentation

## 2020-10-10 DIAGNOSIS — R232 Flushing: Secondary | ICD-10-CM | POA: Insufficient documentation

## 2020-10-10 DIAGNOSIS — D0511 Intraductal carcinoma in situ of right breast: Secondary | ICD-10-CM

## 2020-10-10 MED ORDER — TAMOXIFEN CITRATE 20 MG PO TABS: 20.0000 mg | ORAL_TABLET | Freq: Every day | ORAL | 3 refills | Status: DC

## 2020-10-10 NOTE — Assessment & Plan Note (Addendum)
02/14/2019: Right lumpectomy (Dr. 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 Adjuvant radiation therapy2/01/2020-04/10/2019 2. Followed by antiestrogen therapy with tamoxifen 5 years started 04/10/2019  Tamoxifen toxicities: Hot flashes otherwise tolerating it fairly well  Breast cancer surveillance: 1.  Mammogram 01/18/2020: Benign, breast density category C 2. breast exam 10/10/2020: Benign  Return to clinic in 1 year for follow-up

## 2020-10-15 ENCOUNTER — Telehealth: Payer: Self-pay | Admitting: Hematology and Oncology

## 2020-10-15 NOTE — Telephone Encounter (Signed)
Scheduled per 9/8 los. Called pt and left a msg

## 2021-01-12 IMAGING — MG DIGITAL DIAGNOSTIC BILAT W/ TOMO W/ CAD
9 series · 9 of 25 positions shown · non-contrast
Comparison: Previous exam(s).

CLINICAL DATA: 54-year-old female presenting for annual exam.
History of right breast cancer in 0603 status post lumpectomy. No
new problems.

EXAM:
DIGITAL DIAGNOSTIC BILATERAL MAMMOGRAM WITH TOMO AND CAD

[R MLO]
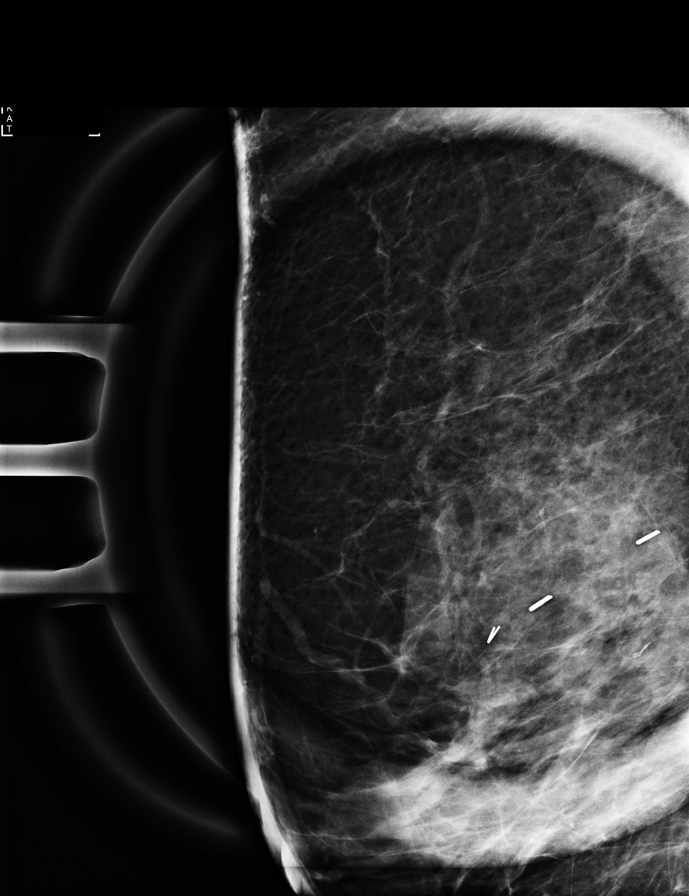

[R CC synth-2D]
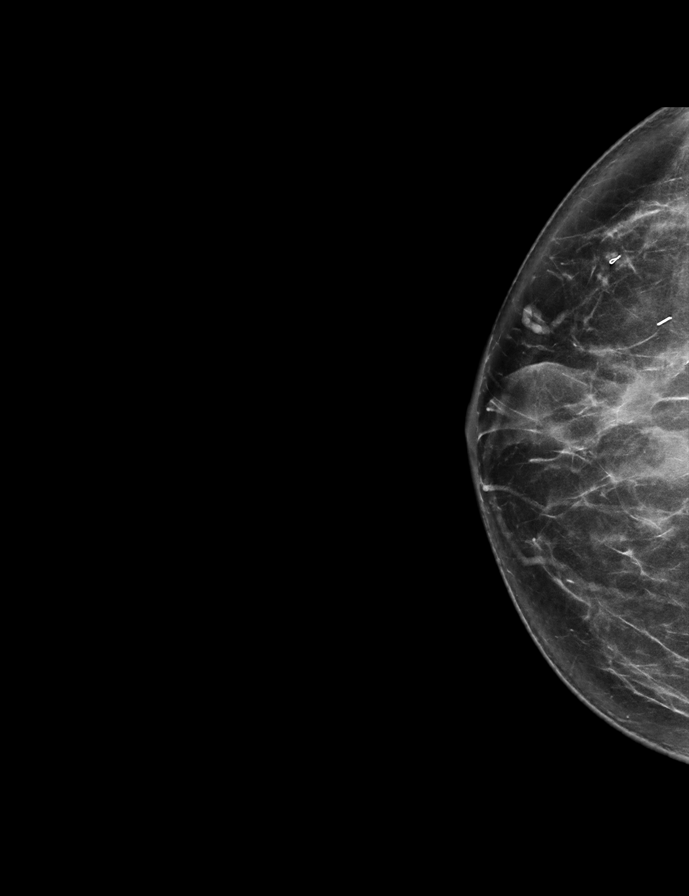

[R MLO synth-2D]
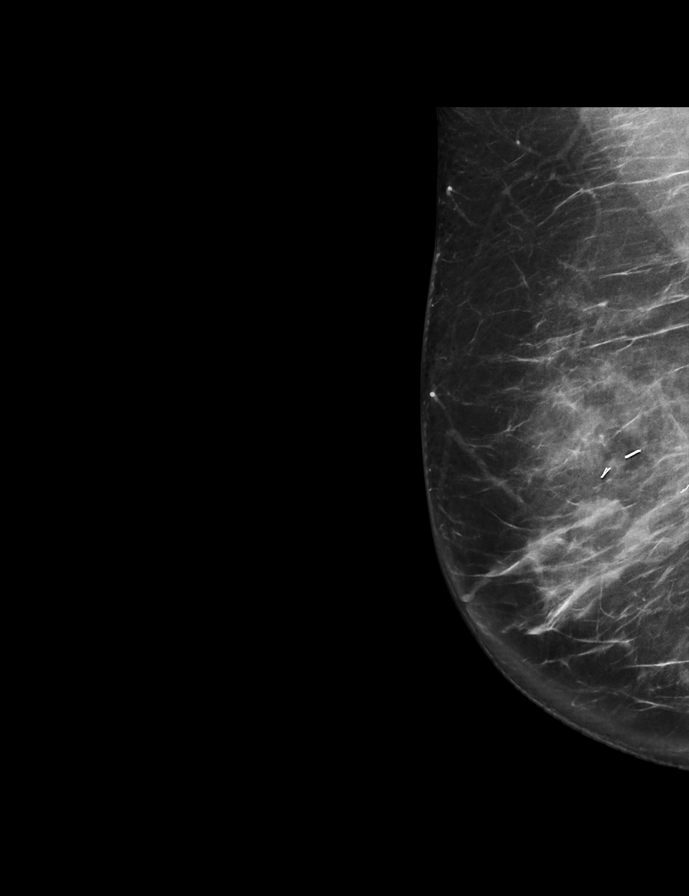

[L CC synth-2D]
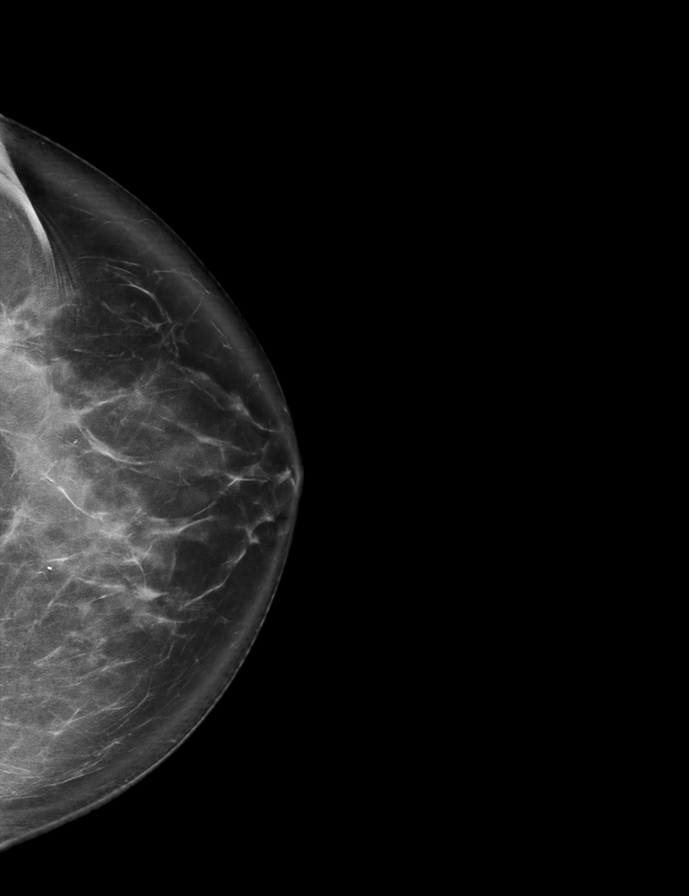

[L MLO synth-2D]
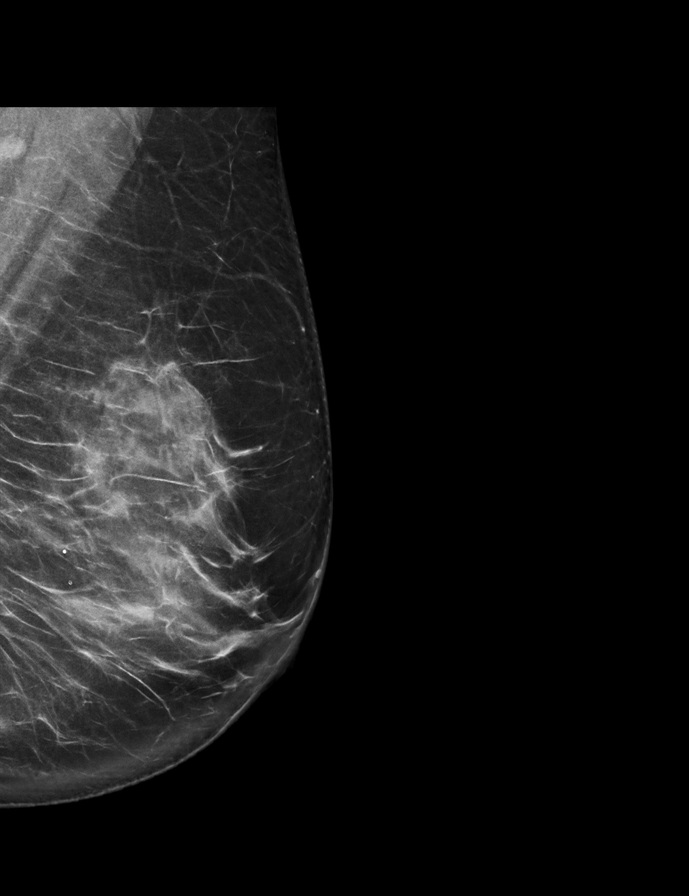

[L CC tomo · tomo slice 45/88.0]
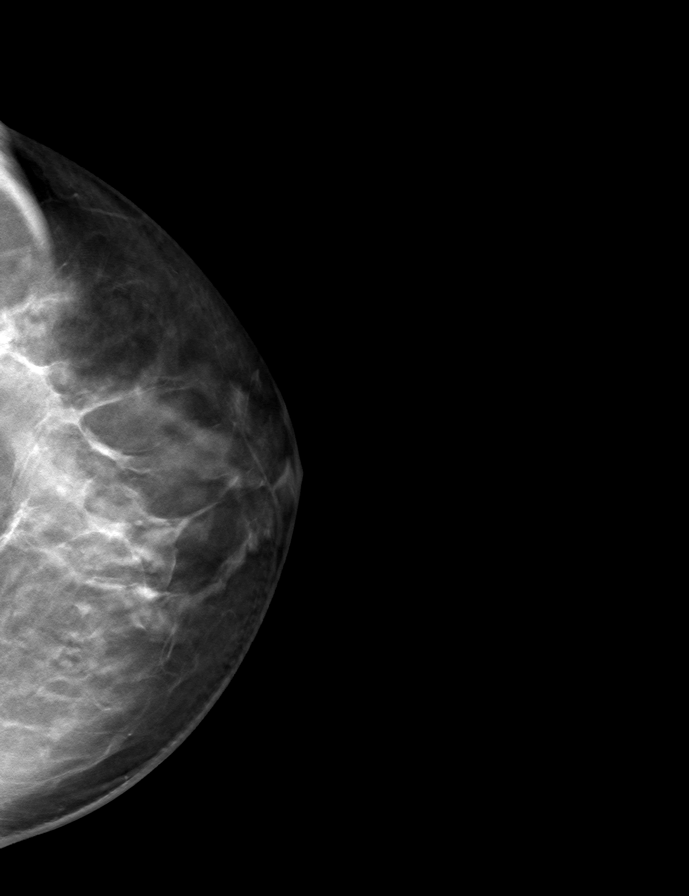

[L MLO tomo · tomo slice 41/82.0]
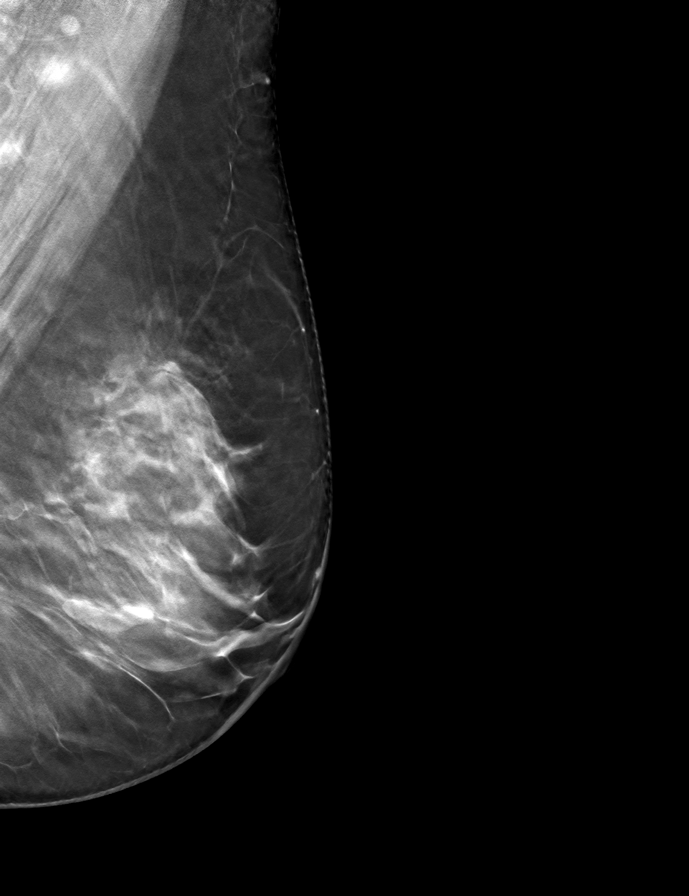

[R CC tomo · tomo slice 35/70.0]
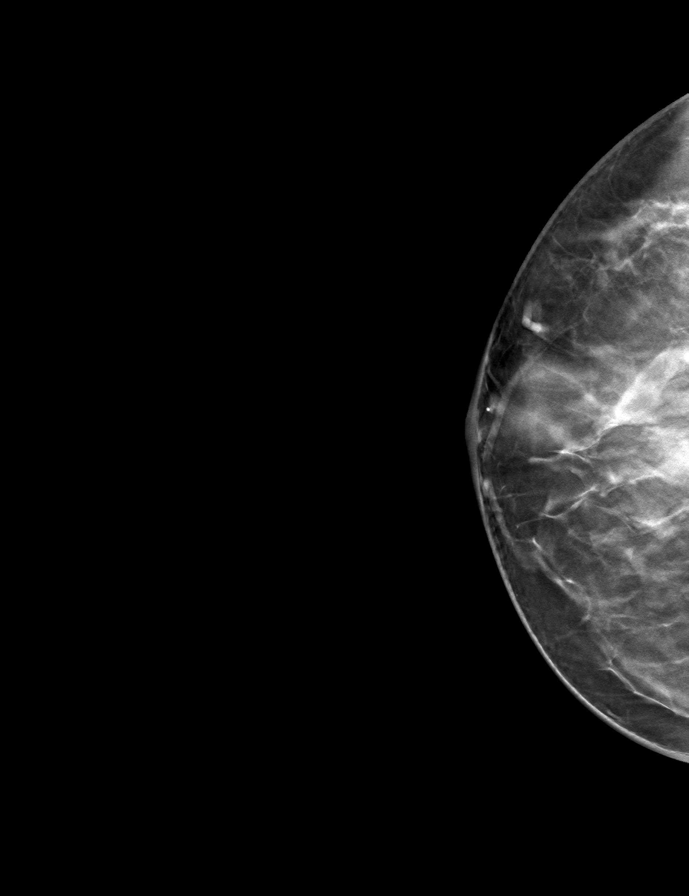

[R MLO tomo · tomo slice 41/80.0]
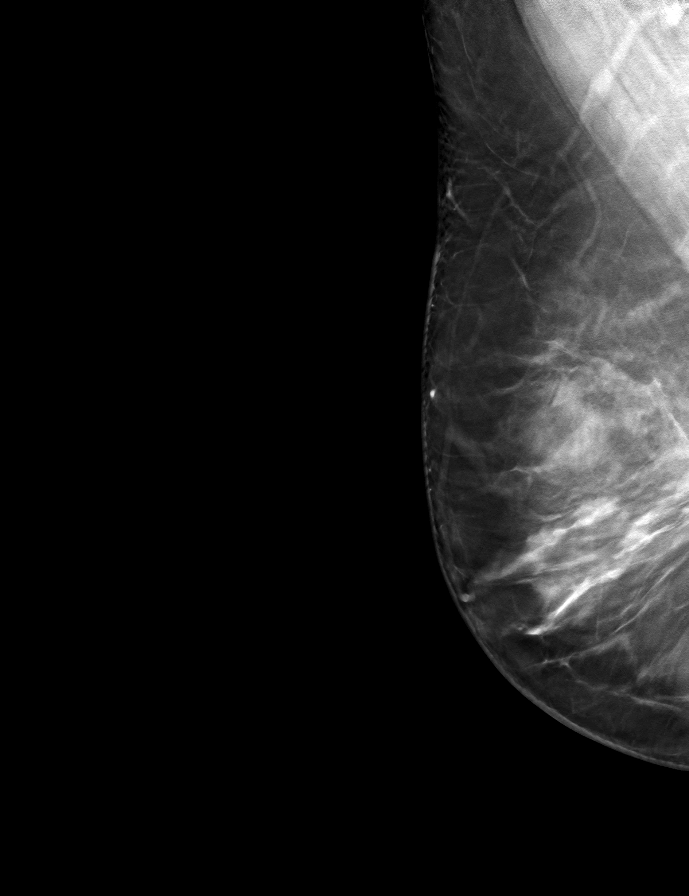

[9 of 25 positions shown; findings below may reference images not displayed]

ACR Breast Density Category c: The breast tissue is heterogeneously
dense, which may obscure small masses.
FINDINGS: Right breast: A spot 2D magnification view of the lumpectomy site
was performed in addition to standard views. There are expected
postsurgical changes. No suspicious mass, distortion, or
microcalcifications are identified to suggest presence of
malignancy.

Left breast: No suspicious mass, distortion, or microcalcifications
are identified to suggest presence of malignancy.

Mammographic images were processed with CAD.
IMPRESSION: Expected postsurgical changes in the right breast. No mammographic
evidence of malignancy bilaterally.

RECOMMENDATION:
Diagnostic mammogram is suggested in 1 year. (Code:[id number])

I have discussed the findings and recommendations with the patient.
If applicable, a reminder letter will be sent to the patient
regarding the next appointment.

BI-RADS CATEGORY  2: Benign.

## 2021-01-28 ENCOUNTER — Ambulatory Visit
Admission: RE | Admit: 2021-01-28 | Discharge: 2021-01-28 | Disposition: A | Discharge status: Home / Self Care | Payer: BC Managed Care – PPO | Source: Ambulatory Visit | Attending: Hematology and Oncology | Admitting: Hematology and Oncology

## 2021-01-28 DIAGNOSIS — Z9889 Other specified postprocedural states: Secondary | ICD-10-CM

## 2021-09-04 ENCOUNTER — Other Ambulatory Visit: Payer: Self-pay | Admitting: Hematology and Oncology

## 2021-09-04 DIAGNOSIS — Z9889 Other specified postprocedural states: Secondary | ICD-10-CM

## 2021-10-09 NOTE — Progress Notes (Signed)
Patient Care Team: Renne Crigler, NP as PCP - General (Nurse Practitioner) Mauro Kaufmann, RN as Oncology Nurse Navigator Rockwell Germany, RN as Oncology Nurse Navigator Rolm Bookbinder, MD as Consulting Physician (General Surgery) Nicholas Lose, MD as Consulting Physician (Hematology and Oncology) Gery Pray, MD as Consulting Physician (Radiation Oncology)  DIAGNOSIS: No diagnosis found.  SUMMARY OF ONCOLOGIC HISTORY: Oncology History  Ductal carcinoma in situ (DCIS) of right breast  01/25/2019 Initial Diagnosis   Patient had history of probably benign right breast calcifications. Mammogram showed two groups of right breast calcifications measuring 0.6cm and 0.3cm and a right breast mass measuring 0.7cm, with no axillary adenopathy. Biopsy on 01/23/19 showed DCIS with calcifications and focal necrosis at the lateral anterior position and lateral posterior position, high grade, ER+ 100%, PR+ 95%. And a benign lymph node representing the mass at the 9 o'clock position.   02/14/2019 Surgery   Right lumpectomy [name]) [phone]): DCIS, intermediate grade, clear margins. No regional lymph nodes were examined.   03/16/2019 - 04/12/2019 Radiation Therapy   The patient initially received a dose of 40.05 Gy in 15 fractions to the breast using whole-breast tangent fields. This was delivered using a 3-D conformal technique. The pt received a boost delivering an additional 10 Gy in 5 fractions using a electron boost with 49mV electrons. The total dose was 50.05 Gy.   04/2019 - 04/2024 Anti-estrogen oral therapy   Tamoxifen     CHIEF COMPLIANT: Follow-up of right breast DCIS on tamoxifen    INTERVAL HISTORY: Kari Roberts a 56y.o. with above-mentioned history of right breast DCIS for which she underwent a lumpectomy, radiation, and is currently on antiestrogen therapy with tamoxifen. She presents to the clinic today for a follow-up.   ALLERGIES:  is allergic to  augmentin [amoxicillin-pot clavulanate].  MEDICATIONS:  Current Outpatient Medications  Medication Sig Dispense Refill   tamoxifen (NOLVADEX) 20 MG tablet Take 1 tablet (20 mg total) by mouth daily. 90 tablet 3   No current facility-administered medications for this visit.    PHYSICAL EXAMINATION: ECOG PERFORMANCE STATUS: {CHL ONC ECOG PS:[phone]}  There were no vitals filed for this visit. There were no vitals filed for this visit.  BREAST:*** No palpable masses or nodules in either right or left breasts. No palpable axillary supraclavicular or infraclavicular adenopathy no breast tenderness or nipple discharge. (exam performed in the presence of a chaperone)  LABORATORY DATA:  I have reviewed the data as listed    Latest Ref Rng & Units 02/01/2019    8:28 AM  CMP  Glucose 70 - 99 mg/dL 129   BUN 6 - 20 mg/dL 7   Creatinine 0.44 - 1.00 mg/dL 0.75   Sodium 135 - 145 mmol/L 140   Potassium 3.5 - 5.1 mmol/L 4.1   Chloride 98 - 111 mmol/L 104   CO2 22 - 32 mmol/L 24   Calcium 8.9 - 10.3 mg/dL 9.2   Total Protein 6.5 - 8.1 g/dL 7.7   Total Bilirubin 0.3 - 1.2 mg/dL 0.3   Alkaline Phos 38 - 126 U/L 96   AST 15 - 41 U/L 19   ALT 0 - 44 U/L 20     Lab Results  Component Value Date   WBC 6.0 02/01/2019   HGB 14.5 02/01/2019   HCT 43.7 02/01/2019   MCV 87.2 02/01/2019   PLT 256 02/01/2019   NEUTROABS 3.9 02/01/2019    ASSESSMENT & PLAN:  No problem-specific Assessment & Plan  notes found for this encounter.    No orders of the defined types were placed in this encounter.  The patient has a good understanding of the overall plan. she agrees with it. she will call with any problems that may develop before the next visit here. Total time spent: 30 mins including face to face time and time spent for planning, charting and co-ordination of care   Kari Roberts, [place] 10/09/21    I Gardiner Coins am scribing for Dr. Lindi Adie  ***

## 2021-10-13 ENCOUNTER — Ambulatory Visit: Payer: BC Managed Care – PPO | Admitting: Hematology and Oncology

## 2021-10-13 ENCOUNTER — Other Ambulatory Visit: Payer: Self-pay

## 2021-10-13 ENCOUNTER — Inpatient Hospital Stay: Payer: BC Managed Care – PPO | Attending: Hematology and Oncology | Admitting: Hematology and Oncology

## 2021-10-13 DIAGNOSIS — Z79810 Long term (current) use of selective estrogen receptor modulators (SERMs): Secondary | ICD-10-CM | POA: Diagnosis not present

## 2021-10-13 DIAGNOSIS — Z923 Personal history of irradiation: Secondary | ICD-10-CM | POA: Insufficient documentation

## 2021-10-13 DIAGNOSIS — D0511 Intraductal carcinoma in situ of right breast: Secondary | ICD-10-CM | POA: Insufficient documentation

## 2021-10-13 DIAGNOSIS — Z170 Estrogen receptor positive status [ER+]: Secondary | ICD-10-CM | POA: Insufficient documentation

## 2021-10-13 MED ORDER — TAMOXIFEN CITRATE 20 MG PO TABS: 20.0000 mg | ORAL_TABLET | Freq: Every day | ORAL | 3 refills | Status: DC

## 2021-10-13 MED ORDER — FENOFIBRATE 145 MG PO TABS: 145.0000 mg | ORAL_TABLET | Freq: Every day | ORAL | Status: AC

## 2021-10-13 NOTE — Assessment & Plan Note (Addendum)
02/14/2019: Right lumpectomy (Dr. 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 Adjuvant radiation therapy2/01/2020-04/10/2019 2. Followed by antiestrogen therapy with tamoxifen 5 yearsstarted 04/10/2019  Tamoxifen toxicities:Hot flashes otherwise tolerating it fairly well.   Breast cancer surveillance: 1.Mammogram  01/28/2021: Benign, breast density category C 2.breast exam  10/13/2021: Benign  Return to clinic in 1 year for follow-up

## 2021-10-15 ENCOUNTER — Telehealth: Payer: Self-pay | Admitting: Hematology and Oncology

## 2021-10-15 NOTE — Telephone Encounter (Signed)
Scheduled appointment per 9/11 los. Left voicemail.

## 2021-10-27 ENCOUNTER — Other Ambulatory Visit: Payer: Self-pay | Admitting: Hematology and Oncology

## 2022-01-28 ENCOUNTER — Other Ambulatory Visit: Payer: Self-pay | Admitting: *Deleted

## 2022-01-28 MED ORDER — TAMOXIFEN CITRATE 20 MG PO TABS: 20.0000 mg | ORAL_TABLET | Freq: Every day | ORAL | 3 refills | Status: DC

## 2022-02-06 ENCOUNTER — Ambulatory Visit
Admission: RE | Admit: 2022-02-06 | Discharge: 2022-02-06 | Disposition: A | Discharge status: Home / Self Care | Payer: BC Managed Care – PPO | Source: Ambulatory Visit | Attending: Hematology and Oncology | Admitting: Hematology and Oncology

## 2022-02-06 DIAGNOSIS — Z9889 Other specified postprocedural states: Secondary | ICD-10-CM

## 2022-10-12 NOTE — Progress Notes (Signed)
Patient Care Team: Verlee Monte, NP as PCP - General (Nurse Practitioner) Pershing Proud, RN as Oncology Nurse Navigator Donnelly Angelica, RN as Oncology Nurse Navigator Emelia Loron, MD as Consulting Physician (General Surgery) Serena Croissant, MD as Consulting Physician (Hematology and Oncology) Antony Blackbird, MD as Consulting Physician (Radiation Oncology)  DIAGNOSIS: No diagnosis found.  SUMMARY OF ONCOLOGIC HISTORY: Oncology History  Ductal carcinoma in situ (DCIS) of right breast  01/25/2019 Initial Diagnosis   Patient had history of probably benign right breast calcifications. Mammogram showed two groups of right breast calcifications measuring 0.6cm and 0.3cm and a right breast mass measuring 0.7cm, with no axillary adenopathy. Biopsy on 01/23/19 showed DCIS with calcifications and focal necrosis at the lateral anterior position and lateral posterior position, high grade, ER+ 100%, PR+ 95%. And a benign lymph node representing the mass at the 9 o'clock position.   02/14/2019 Surgery   Right lumpectomy Dwain Sarna) [phone]): DCIS, intermediate grade, clear margins. No regional lymph nodes were examined.   03/16/2019 - 04/12/2019 Radiation Therapy   The patient initially received a dose of 40.05 Gy in 15 fractions to the breast using whole-breast tangent fields. This was delivered using a 3-D conformal technique. The pt received a boost delivering an additional 10 Gy in 5 fractions using a electron boost with electrons. The total dose was 50.05 Gy.   04/2019 - 04/2024 Anti-estrogen oral therapy   Tamoxifen     CHIEF COMPLIANT: Follow-up of right breast DCIS on tamoxifen   INTERVAL HISTORY: Kari Roberts is a 57 y.o. with above-mentioned history of right breast DCIS for which she underwent a lumpectomy, radiation, and is currently on antiestrogen therapy with tamoxifen. She presents to the clinic today for a follow-up.    ALLERGIES:  is allergic to augmentin  [amoxicillin-pot clavulanate].  MEDICATIONS:  Current Outpatient Medications  Medication Sig Dispense Refill   fenofibrate (TRICOR) 145 MG tablet Take 1 tablet (145 mg total) by mouth daily.     tamoxifen (NOLVADEX) 20 MG tablet Take 1 tablet (20 mg total) by mouth daily. 90 tablet 3   No current facility-administered medications for this visit.    PHYSICAL EXAMINATION: ECOG PERFORMANCE STATUS: {CHL ONC ECOG [phone]}  There were no vitals filed for this visit. There were no vitals filed for this visit.  BREAST:*** No palpable masses or nodules in either right or left breasts. No palpable axillary supraclavicular or infraclavicular adenopathy no breast tenderness or nipple discharge. (exam performed in the presence of a chaperone)  LABORATORY DATA:  I have reviewed the data as listed    Latest Ref Rng & Units 02/01/2019    8:28 AM  CMP  Glucose 70 - 99 mg/dL 981   BUN 6 - 20 mg/dL 7   Creatinine 1.91 - 4.78 mg/dL 2.95   Sodium 621 - 308 mmol/L 140   Potassium 3.5 - 5.1 mmol/L 4.1   Chloride 98 - 111 mmol/L 104   CO2 22 - 32 mmol/L 24   Calcium 8.9 - 10.3 mg/dL 9.2   Total Protein 6.5 - 8.1 g/dL 7.7   Total Bilirubin 0.3 - 1.2 mg/dL 0.3   Alkaline Phos 38 - 126 U/L 96   AST 15 - 41 U/L 19   ALT 0 - 44 U/L 20     Lab Results  Component Value Date   WBC 6.0 02/01/2019   HGB 14.5 02/01/2019   HCT 43.7 02/01/2019   MCV 87.2 02/01/2019   PLT 256  02/01/2019   NEUTROABS 3.9 02/01/2019    ASSESSMENT & PLAN:  No problem-specific Assessment & Plan notes found for this encounter.    No orders of the defined types were placed in this encounter.  The patient has a good understanding of the overall plan. she agrees with it. she will call with any problems that may develop before the next visit here. Total time spent: 30 mins including face to face time and time spent for planning, charting and co-ordination of care   Sherlyn Lick, CMA 10/12/22    I Janan Ridge am acting as a Neurosurgeon for [employer]  ***

## 2022-10-14 ENCOUNTER — Ambulatory Visit: Payer: BC Managed Care – PPO | Admitting: Hematology and Oncology

## 2022-10-15 ENCOUNTER — Inpatient Hospital Stay: Payer: Managed Care, Other (non HMO) | Attending: Hematology and Oncology | Admitting: Hematology and Oncology

## 2022-10-15 VITALS — BP 122/74 | HR 82 | Temp 97.5°F | Resp 18 | Ht 64.0 in | Wt 140.6 lb

## 2022-10-15 DIAGNOSIS — M2560 Stiffness of unspecified joint, not elsewhere classified: Secondary | ICD-10-CM | POA: Diagnosis not present

## 2022-10-15 DIAGNOSIS — D0511 Intraductal carcinoma in situ of right breast: Secondary | ICD-10-CM

## 2022-10-15 DIAGNOSIS — Z170 Estrogen receptor positive status [ER+]: Secondary | ICD-10-CM | POA: Diagnosis not present

## 2022-10-15 DIAGNOSIS — Z79810 Long term (current) use of selective estrogen receptor modulators (SERMs): Secondary | ICD-10-CM | POA: Insufficient documentation

## 2022-10-15 DIAGNOSIS — Z923 Personal history of irradiation: Secondary | ICD-10-CM | POA: Diagnosis not present

## 2022-10-15 DIAGNOSIS — R232 Flushing: Secondary | ICD-10-CM | POA: Diagnosis not present

## 2022-10-15 NOTE — Assessment & Plan Note (Signed)
02/14/2019: Right lumpectomy (Dr. Dwain Sarna): Intermediate grade focus of DCIS 0.2 cm, margins negative, ER 100%, PR 90% Tis NX stage 0   Treatment plan: 1. Adjuvant radiation therapy 03/17/2019-04/10/2019 2. Followed by antiestrogen therapy with tamoxifen 5 years started 04/10/2019   Tamoxifen toxicities: Hot flashes otherwise tolerating it fairly well.     Breast cancer surveillance: 1.  Mammogram  01/28/2021: Benign, breast density category C 2. breast exam 02/06/2022 benign, density category C   Return to clinic in 1 year for follow-up

## 2022-11-19 ENCOUNTER — Other Ambulatory Visit: Payer: Self-pay | Admitting: Nurse Practitioner

## 2022-11-19 DIAGNOSIS — Z1231 Encounter for screening mammogram for malignant neoplasm of breast: Secondary | ICD-10-CM

## 2023-02-12 ENCOUNTER — Ambulatory Visit: Payer: Managed Care, Other (non HMO)

## 2023-02-12 ENCOUNTER — Ambulatory Visit
Admission: RE | Admit: 2023-02-12 | Discharge: 2023-02-12 | Disposition: A | Discharge status: Home / Self Care | Payer: Managed Care, Other (non HMO) | Source: Ambulatory Visit | Attending: Nurse Practitioner | Admitting: Nurse Practitioner

## 2023-02-12 DIAGNOSIS — Z1231 Encounter for screening mammogram for malignant neoplasm of breast: Secondary | ICD-10-CM

## 2023-02-14 ENCOUNTER — Encounter: Payer: Self-pay | Admitting: Hematology and Oncology

## 2023-02-15 ENCOUNTER — Other Ambulatory Visit: Payer: Self-pay | Admitting: *Deleted

## 2023-02-15 MED ORDER — TAMOXIFEN CITRATE 20 MG PO TABS: 20.0000 mg | ORAL_TABLET | Freq: Every day | ORAL | 3 refills | Status: AC

## 2023-10-21 ENCOUNTER — Inpatient Hospital Stay: Payer: Managed Care, Other (non HMO) | Attending: Hematology and Oncology | Admitting: Hematology and Oncology

## 2023-10-21 VITALS — BP 118/72 | HR 93 | Temp 98.0°F | Resp 18 | Ht 64.0 in | Wt 142.2 lb

## 2023-10-21 DIAGNOSIS — Z79810 Long term (current) use of selective estrogen receptor modulators (SERMs): Secondary | ICD-10-CM | POA: Diagnosis not present

## 2023-10-21 DIAGNOSIS — D0511 Intraductal carcinoma in situ of right breast: Secondary | ICD-10-CM | POA: Diagnosis not present

## 2023-10-21 NOTE — Progress Notes (Signed)
 Patient Care Team: Devra Devere SAUNDERS, NP as PCP - General (Nurse Practitioner) Tyree Nanetta SAILOR, RN as Oncology Nurse Navigator Ebbie Cough, MD as Consulting Physician (General Surgery) Odean Potts, MD as Consulting Physician (Hematology and Oncology) Shannon Agent, MD as Consulting Physician (Radiation Oncology)  DIAGNOSIS:  Encounter Diagnosis  Name Primary?   Ductal carcinoma in situ (DCIS) of right breast Yes    SUMMARY OF ONCOLOGIC HISTORY: Oncology History  Ductal carcinoma in situ (DCIS) of right breast  01/25/2019 Initial Diagnosis   Patient had history of probably benign right breast calcifications. Mammogram showed two groups of right breast calcifications measuring 0.6cm and 0.3cm and a right breast mass measuring 0.7cm, with no axillary adenopathy. Biopsy on 01/23/19 showed DCIS with calcifications and focal necrosis at the lateral anterior position and lateral posterior position, high grade, ER+ 100%, PR+ 95%. And a benign lymph node representing the mass at the 9 o'clock position.   02/14/2019 Surgery   Right lumpectomy Viktor[place]) [phone]): DCIS, intermediate grade, clear margins. No regional lymph nodes were examined.   03/16/2019 - 04/12/2019 Radiation Therapy   The patient initially received a dose of 40.05 Gy in 15 fractions to the breast using whole-breast tangent fields. This was delivered using a 3-D conformal technique. The pt received a boost delivering an additional 10 Gy in 5 fractions using a electron boost with electrons. The total dose was 50.05 Gy.   04/2019 - 04/2024 Anti-estrogen oral therapy   Tamoxifen      CHIEF COMPLIANT: Follow-up on tamoxifen  therapy for DCIS  HISTORY OF PRESENT ILLNESS:  History of Present Illness Kari Roberts is a 58 year old female with ductal carcinoma in situ of the right breast who presents for follow-up regarding her tamoxifen  treatment.  She is nearing the completion of her tamoxifen  treatment  with approximately six months remaining. She experiences hot flashes but intends to continue the medication until completion. She occasionally experiences brief, transient pain described as a 'jab of pain' or 'soreness' that resolves quickly. Her breast density is categorized as C. She has one bottle of tamoxifen  and two additional bottles remaining.     ALLERGIES:  is allergic to augmentin [amoxicillin-pot clavulanate].  MEDICATIONS:  Current Outpatient Medications  Medication Sig Dispense Refill   fenofibrate  (TRICOR ) 145 MG tablet Take 1 tablet (145 mg total) by mouth daily.     tamoxifen  (NOLVADEX ) 20 MG tablet Take 1 tablet (20 mg total) by mouth daily. 90 tablet 3   No current facility-administered medications for this visit.    PHYSICAL EXAMINATION: ECOG PERFORMANCE STATUS: 1 - Symptomatic but completely ambulatory  Vitals:   10/21/23 1519  BP: 118/72  Pulse: 93  Resp: 18  Temp: 98 F (36.7 C)  SpO2: 98%   Filed Weights   10/21/23 1519  Weight: 142 lb 3.2 oz (64.5 kg)    Physical Exam No palpable lumps noted to bilateral breasts or axilla  (exam performed in the presence of a chaperone)  LABORATORY DATA:  I have reviewed the data as listed    Latest Ref Rng & Units 02/01/2019    8:28 AM  CMP  Glucose 70 - 99 mg/dL 870   BUN 6 - 20 mg/dL 7   Creatinine 9.55 - 8.99 mg/dL 9.24   Sodium 864 - 854 mmol/L 140   Potassium 3.5 - 5.1 mmol/L 4.1   Chloride 98 - 111 mmol/L 104   CO2 22 - 32 mmol/L 24   Calcium 8.9 - 10.3 mg/dL  9.2   Total Protein 6.5 - 8.1 g/dL 7.7   Total Bilirubin 0.3 - 1.2 mg/dL 0.3   Alkaline Phos 38 - 126 U/L 96   AST 15 - 41 U/L 19   ALT 0 - 44 U/L 20     Lab Results  Component Value Date   WBC 6.0 02/01/2019   HGB 14.5 02/01/2019   HCT 43.7 02/01/2019   MCV 87.2 02/01/2019   PLT 256 02/01/2019   NEUTROABS 3.9 02/01/2019    ASSESSMENT & PLAN:  Ductal carcinoma in situ (DCIS) of right breast 02/14/2019: Right lumpectomy (Dr.  Ebbie): Intermediate grade focus of DCIS 0.2 cm, margins negative, ER 100%, PR 90% Tis NX stage 0   Treatment plan: 1. Adjuvant radiation therapy 03/17/2019-04/10/2019 2. Followed by antiestrogen therapy with tamoxifen  5 years started 04/10/2019   Tamoxifen  toxicities: Moderate hot flashes otherwise tolerating it fairly well.    Breast cancer surveillance: 1.  Mammogram 02/15/2023: Benign, breast density category C 2. breast exam 10/21/2023 benign   Return to clinic in 1 year for follow-up and after that she could be seen on an as-needed basis ------------------------------------- Assessment and Plan Assessment & Plan Ductal carcinoma in situ (DCIS) of right breast, status post treatment, on adjuvant tamoxifen  Nearing completion of five-year tamoxifen  course. Hot flashes present but patient prefers continuation. Breast density C, expected reduction with tamoxifen . - Continue tamoxifen  until February 2026. - Schedule mammogram January 2026 to assess breast density. - Discuss follow-up options with primary care or nurse practitioner for long-term management.      No orders of the defined types were placed in this encounter.  The patient has a good understanding of the overall plan. she agrees with it. she will call with any problems that may develop before the next visit here. Total time spent: 30 mins including face to face time and time spent for planning, charting and co-ordination of care   Kari MARLA Chad, MD 10/21/23

## 2023-10-21 NOTE — Assessment & Plan Note (Signed)
 02/14/2019: Right lumpectomy (Dr. Ebbie): Intermediate grade focus of DCIS 0.2 cm, margins negative, ER 100%, PR 90% Tis NX stage 0   Treatment plan: 1. Adjuvant radiation therapy 03/17/2019-04/10/2019 2. Followed by antiestrogen therapy with tamoxifen  5 years started 04/10/2019   Tamoxifen  toxicities: Hot flashes otherwise tolerating it fairly well.    Breast cancer surveillance: 1.  Mammogram 02/15/2023: Benign, breast density category C 2. breast exam 10/21/2023 benign   Return to clinic in 1 year for follow-up

## 2023-11-05 ENCOUNTER — Encounter (HOSPITAL_COMMUNITY): Payer: Self-pay | Admitting: General Surgery

## 2023-11-05 ENCOUNTER — Other Ambulatory Visit: Payer: Self-pay

## 2023-11-05 NOTE — Progress Notes (Signed)
 PCP - Devra Pulling R  EKG - Denies Chest x-ray - Denies ECHO - Denies Cardiac Cath - Denies  Sleep Study -Denies DM Denies Blood Thinner Instructions: Denies  ERAS Protcol - Yes COVID TEST- N/A  Anesthesia review: N/A  -------------  SDW INSTRUCTIONS:  Your procedure is scheduled on Monday Oct 06. Please report to [facility] Main Entrance A at 0530 A.M., and check in at the Admitting office. Call this number if you have problems the morning of surgery: [phone]   Remember: Do not eat or drink after midnight the night before your surgery  You may drink clear liquids until 0430 the morning of your surgery.   Clear liquids allowed are: Water, Non-Citrus Juices (without pulp), Carbonated Beverages, Clear Tea, Black Coffee Only, and Gatorade   Medications to take morning of surgery with a sip of water include: Fenofibrate  Tamoxifen   As of today, STOP taking any Aspirin (unless otherwise instructed by your surgeon), Aleve, Naproxen, Ibuprofen, Motrin, Advil, Goody's, BC's, all herbal medications, fish oil, and all vitamins.    The Morning of Surgery Do not wear jewelry, make-up or nail polish. Do not wear lotions, powders, or perfumes/colognes, or deodorant Do not bring valuables to the hospital. [facility] is not responsible for any belongings or valuables.  If you are a smoker, DO NOT Smoke 24 hours prior to surgery  If you wear a CPAP at night please bring your mask the morning of surgery   Remember that you must have someone to transport you home after your surgery, and remain with you for 24 hours if you are discharged the same day.  Please bring cases for contacts, glasses, hearing aids, dentures or bridgework because it cannot be worn into surgery.   Patients discharged the day of surgery will not be allowed to drive home.   Please shower the NIGHT BEFORE/MORNING OF SURGERY (use antibacterial soap like DIAL soap if possible). Wear comfortable clothes the  morning of surgery. Oral Hygiene is also important to reduce your risk of infection.  Remember - BRUSH YOUR TEETH THE MORNING OF SURGERY WITH YOUR REGULAR TOOTHPASTE  Patient denies shortness of breath, fever, cough and chest pain.

## 2023-11-08 ENCOUNTER — Ambulatory Visit (HOSPITAL_BASED_OUTPATIENT_CLINIC_OR_DEPARTMENT_OTHER): Payer: Self-pay | Admitting: Anesthesiology

## 2023-11-08 ENCOUNTER — Ambulatory Visit (HOSPITAL_COMMUNITY): Payer: Self-pay | Admitting: Anesthesiology

## 2023-11-08 ENCOUNTER — Encounter (HOSPITAL_COMMUNITY)
Admission: RE | Disposition: A | Discharge status: Home / Self Care | Payer: Self-pay | Source: Home / Self Care | Attending: General Surgery

## 2023-11-08 ENCOUNTER — Other Ambulatory Visit: Payer: Self-pay

## 2023-11-08 ENCOUNTER — Ambulatory Visit (HOSPITAL_COMMUNITY)
Admission: RE | Admit: 2023-11-08 | Discharge: 2023-11-08 | Disposition: A | Discharge status: Home / Self Care | Attending: General Surgery | Admitting: General Surgery

## 2023-11-08 DIAGNOSIS — K8010 Calculus of gallbladder with chronic cholecystitis without obstruction: Secondary | ICD-10-CM

## 2023-11-08 DIAGNOSIS — D0511 Intraductal carcinoma in situ of right breast: Secondary | ICD-10-CM | POA: Diagnosis not present

## 2023-11-08 DIAGNOSIS — K828 Other specified diseases of gallbladder: Secondary | ICD-10-CM | POA: Diagnosis present

## 2023-11-08 HISTORY — PX: CHOLECYSTECTOMY: SHX55

## 2023-11-08 LAB — CBC
HCT: 42.3 % (ref 36.0–46.0)
Hemoglobin: 13.3 g/dL (ref 12.0–15.0)
MCH: 28.4 pg (ref 26.0–34.0)
MCHC: 31.4 g/dL (ref 30.0–36.0)
MCV: 90.4 fL (ref 80.0–100.0)
Platelets: 300 K/uL (ref 150–400)
RBC: 4.68 MIL/uL (ref 3.87–5.11)
RDW: 12.9 % (ref 11.5–15.5)
WBC: 7.6 K/uL (ref 4.0–10.5)
nRBC: 0 % (ref 0.0–0.2)

## 2023-11-08 LAB — BASIC METABOLIC PANEL WITH GFR
Anion gap: 9 (ref 5–15)
BUN: 17 mg/dL (ref 6–20)
CO2: 24 mmol/L (ref 22–32)
Calcium: 8.7 mg/dL — ABNORMAL LOW (ref 8.9–10.3)
Chloride: 106 mmol/L (ref 98–111)
Creatinine, Ser: 0.82 mg/dL (ref 0.44–1.00)
GFR, Estimated: >60 mL/min (ref >60)
Glucose, Bld: 99 mg/dL (ref 70–99)
Potassium: 3.5 mmol/L (ref 3.5–5.1)
Sodium: 139 mmol/L (ref 135–145)

## 2023-11-08 SURGERY — LAPAROSCOPIC CHOLECYSTECTOMY
Anesthesia: General

## 2023-11-08 MED ORDER — ONDANSETRON HCL 4 MG/2ML IJ SOLN
INTRAMUSCULAR | Status: DC | PRN
  Administered 2023-11-08: 4 mg via INTRAVENOUS

## 2023-11-08 MED ORDER — KETOROLAC TROMETHAMINE 30 MG/ML IJ SOLN
INTRAMUSCULAR | Status: AC
  Filled 2023-11-08: qty 1

## 2023-11-08 MED ORDER — DEXMEDETOMIDINE HCL IN NACL 80 MCG/20ML IV SOLN
INTRAVENOUS | Status: AC
  Filled 2023-11-08: qty 20

## 2023-11-08 MED ORDER — ONDANSETRON HCL 4 MG/2ML IJ SOLN
INTRAMUSCULAR | Status: AC
  Filled 2023-11-08: qty 2

## 2023-11-08 MED ORDER — BUPIVACAINE-EPINEPHRINE 0.25% -1:200000 IJ SOLN
INTRAMUSCULAR | Status: DC | PRN
  Administered 2023-11-08: 4 mL

## 2023-11-08 MED ORDER — SODIUM CHLORIDE 0.9 % IV SOLN: 250.0000 mL | INTRAVENOUS | Status: DC | PRN

## 2023-11-08 MED ORDER — DEXAMETHASONE SODIUM PHOSPHATE 10 MG/ML IJ SOLN
INTRAMUSCULAR | Status: AC
Start: 2023-11-08 — End: 2023-11-08
  Filled 2023-11-08: qty 1

## 2023-11-08 MED ORDER — ORAL CARE MOUTH RINSE: 15.0000 mL | Freq: Once | OROMUCOSAL | Status: AC

## 2023-11-08 MED ORDER — LACTATED RINGERS IV SOLN: INTRAVENOUS | Status: DC

## 2023-11-08 MED ORDER — SUGAMMADEX SODIUM 200 MG/2ML IV SOLN
INTRAVENOUS | Status: DC | PRN
  Administered 2023-11-08: 200 mg via INTRAVENOUS

## 2023-11-08 MED ORDER — FENTANYL CITRATE (PF) 250 MCG/5ML IJ SOLN
INTRAMUSCULAR | Status: AC
  Filled 2023-11-08: qty 5

## 2023-11-08 MED ORDER — FENTANYL CITRATE (PF) 100 MCG/2ML IJ SOLN
INTRAMUSCULAR | Status: AC
  Filled 2023-11-08: qty 2

## 2023-11-08 MED ORDER — DEXAMETHASONE SODIUM PHOSPHATE 10 MG/ML IJ SOLN
INTRAMUSCULAR | Status: AC
  Filled 2023-11-08: qty 1

## 2023-11-08 MED ORDER — PROPOFOL 10 MG/ML IV BOLUS
INTRAVENOUS | Status: DC | PRN
Start: 2023-11-08 — End: 2023-11-08
  Administered 2023-11-08: 130 mg via INTRAVENOUS

## 2023-11-08 MED ORDER — LIDOCAINE 2% (20 MG/ML) 5 ML SYRINGE
INTRAMUSCULAR | Status: AC
  Filled 2023-11-08: qty 5

## 2023-11-08 MED ORDER — BUPIVACAINE-EPINEPHRINE (PF) 0.25% -1:200000 IJ SOLN
INTRAMUSCULAR | Status: AC
Start: 2023-11-08 — End: 2023-11-08
  Filled 2023-11-08: qty 30

## 2023-11-08 MED ORDER — ROCURONIUM BROMIDE 10 MG/ML (PF) SYRINGE
PREFILLED_SYRINGE | INTRAVENOUS | Status: DC | PRN
  Administered 2023-11-08: 50 mg via INTRAVENOUS

## 2023-11-08 MED ORDER — FENTANYL CITRATE (PF) 100 MCG/2ML IJ SOLN
25.0000 ug | INTRAMUSCULAR | Status: DC | PRN
  Administered 2023-11-08: 50 ug via INTRAVENOUS

## 2023-11-08 MED ORDER — OXYCODONE HCL 5 MG PO TABS: 5.0000 mg | ORAL_TABLET | Freq: Once | ORAL | Status: DC | PRN

## 2023-11-08 MED ORDER — ROCURONIUM BROMIDE 10 MG/ML (PF) SYRINGE
PREFILLED_SYRINGE | INTRAVENOUS | Status: AC
  Filled 2023-11-08: qty 10

## 2023-11-08 MED ORDER — CEFAZOLIN SODIUM-DEXTROSE 2-3 GM-%(50ML) IV SOLR
INTRAVENOUS | Status: DC | PRN
Start: 2023-11-08 — End: 2023-11-08
  Administered 2023-11-08: 2 g via INTRAVENOUS

## 2023-11-08 MED ORDER — LIDOCAINE 2% (20 MG/ML) 5 ML SYRINGE
INTRAMUSCULAR | Status: DC | PRN
  Administered 2023-11-08: 60 mg via INTRAVENOUS

## 2023-11-08 MED ORDER — DROPERIDOL 2.5 MG/ML IJ SOLN
INTRAMUSCULAR | Status: AC
  Filled 2023-11-08: qty 2

## 2023-11-08 MED ORDER — SODIUM CHLORIDE 0.9% FLUSH: 3.0000 mL | INTRAVENOUS | Status: DC | PRN

## 2023-11-08 MED ORDER — DEXAMETHASONE SODIUM PHOSPHATE 10 MG/ML IJ SOLN
INTRAMUSCULAR | Status: DC | PRN
  Administered 2023-11-08: 5 mg via INTRAVENOUS

## 2023-11-08 MED ORDER — ACETAMINOPHEN 500 MG PO TABS
1000.0000 mg | ORAL_TABLET | Freq: Once | ORAL | Status: AC
  Administered 2023-11-08: 1000 mg via ORAL
  Filled 2023-11-08: qty 2

## 2023-11-08 MED ORDER — INDOCYANINE GREEN 25 MG IV SOLR
1.2500 mg | Freq: Once | INTRAVENOUS | Status: AC
  Administered 2023-11-08: 1.25 mg via INTRAVENOUS
  Filled 2023-11-08: qty 10

## 2023-11-08 MED ORDER — PROPOFOL 10 MG/ML IV BOLUS
INTRAVENOUS | Status: AC
  Filled 2023-11-08: qty 20

## 2023-11-08 MED ORDER — CIPROFLOXACIN IN D5W 400 MG/200ML IV SOLN
400.0000 mg | Freq: Once | INTRAVENOUS | Status: DC
  Filled 2023-11-08: qty 200

## 2023-11-08 MED ORDER — CHLORHEXIDINE GLUCONATE 0.12 % MT SOLN
15.0000 mL | Freq: Once | OROMUCOSAL | Status: AC
  Administered 2023-11-08: 15 mL via OROMUCOSAL
  Filled 2023-11-08: qty 15

## 2023-11-08 MED ORDER — ACETAMINOPHEN 650 MG RE SUPP: 650.0000 mg | RECTAL | Status: DC | PRN

## 2023-11-08 MED ORDER — SODIUM CHLORIDE 0.9% FLUSH: 3.0000 mL | Freq: Two times a day (BID) | INTRAVENOUS | Status: DC

## 2023-11-08 MED ORDER — OXYCODONE HCL 5 MG PO TABS: 5.0000 mg | ORAL_TABLET | ORAL | Status: DC | PRN

## 2023-11-08 MED ORDER — OXYCODONE HCL 5 MG/5ML PO SOLN: 5.0000 mg | Freq: Once | ORAL | Status: DC | PRN

## 2023-11-08 MED ORDER — DROPERIDOL 2.5 MG/ML IJ SOLN
0.6250 mg | Freq: Once | INTRAMUSCULAR | Status: AC | PRN
  Administered 2023-11-08: 0.625 mg via INTRAVENOUS

## 2023-11-08 MED ORDER — TRAMADOL HCL 50 MG PO TABS: 50.0000 mg | ORAL_TABLET | Freq: Four times a day (QID) | ORAL | 0 refills | Status: AC | PRN

## 2023-11-08 MED ORDER — MIDAZOLAM HCL 2 MG/2ML IJ SOLN
INTRAMUSCULAR | Status: DC | PRN
  Administered 2023-11-08: 2 mg via INTRAVENOUS

## 2023-11-08 MED ORDER — FENTANYL CITRATE (PF) 250 MCG/5ML IJ SOLN
INTRAMUSCULAR | Status: DC | PRN
  Administered 2023-11-08: 50 ug via INTRAVENOUS
  Administered 2023-11-08 (×2): 75 ug via INTRAVENOUS

## 2023-11-08 MED ORDER — MIDAZOLAM HCL 2 MG/2ML IJ SOLN
INTRAMUSCULAR | Status: AC
  Filled 2023-11-08: qty 2

## 2023-11-08 MED ORDER — CEFAZOLIN SODIUM 1 G IJ SOLR
INTRAMUSCULAR | Status: AC
  Filled 2023-11-08: qty 20

## 2023-11-08 MED ORDER — SODIUM CHLORIDE (PF) 0.9 % IJ SOLN
INTRAMUSCULAR | Status: AC
  Filled 2023-11-08: qty 20

## 2023-11-08 MED ORDER — KETOROLAC TROMETHAMINE 30 MG/ML IJ SOLN
INTRAMUSCULAR | Status: DC | PRN
Start: 2023-11-08 — End: 2023-11-08
  Administered 2023-11-08: 15 mg via INTRAVENOUS

## 2023-11-08 MED ORDER — PROPOFOL 10 MG/ML IV BOLUS
INTRAVENOUS | Status: AC
Start: 2023-11-08 — End: 2023-11-08
  Filled 2023-11-08: qty 20

## 2023-11-08 MED ORDER — ACETAMINOPHEN 325 MG PO TABS: 650.0000 mg | ORAL_TABLET | ORAL | Status: DC | PRN

## 2023-11-08 SURGICAL SUPPLY — 36 items
BAG COUNTER SPONGE SURGICOUNT (BAG) ×1 IMPLANT
BLADE CLIPPER SURG (BLADE) IMPLANT
CANISTER SUCTION 3000ML PPV (SUCTIONS) ×1 IMPLANT
CHLORAPREP W/TINT 26 (MISCELLANEOUS) ×1 IMPLANT
CLIP APPLIE 5 13 M/L LIGAMAX5 (MISCELLANEOUS) ×1 IMPLANT
COVER SURGICAL LIGHT HANDLE (MISCELLANEOUS) ×1 IMPLANT
DERMABOND ADVANCED .7 DNX12 (GAUZE/BANDAGES/DRESSINGS) ×1 IMPLANT
ELECTRODE REM PT RTRN 9FT ADLT (ELECTROSURGICAL) ×1 IMPLANT
GLOVE BIO SURGEON STRL SZ7 (GLOVE) ×1 IMPLANT
GLOVE BIOGEL PI IND STRL 7.5 (GLOVE) ×1 IMPLANT
GOWN STRL REUS W/ TWL LRG LVL3 (GOWN DISPOSABLE) ×3 IMPLANT
GRASPER SUT TROCAR 14GX15 (MISCELLANEOUS) ×1 IMPLANT
IRRIGATION SUCT STRKRFLW 2 WTP (MISCELLANEOUS) ×1 IMPLANT
KIT BASIN OR (CUSTOM PROCEDURE TRAY) ×1 IMPLANT
KIT IMAGING PINPOINTPAQ (MISCELLANEOUS) IMPLANT
KIT TURNOVER KIT B (KITS) ×1 IMPLANT
PAD ARMBOARD POSITIONER FOAM (MISCELLANEOUS) ×1 IMPLANT
POUCH RETRIEVAL ECOSAC 10 (ENDOMECHANICALS) ×1 IMPLANT
SCISSORS LAP 5X35 DISP (ENDOMECHANICALS) ×1 IMPLANT
SET TUBE SMOKE EVAC HIGH FLOW (TUBING) ×1 IMPLANT
SLEEVE Z-THREAD 5X100MM (TROCAR) ×2 IMPLANT
SOLN 0.9% NACL 1000 ML (IV SOLUTION) ×1 IMPLANT
SOLN 0.9% NACL POUR BTL 1000ML (IV SOLUTION) ×1 IMPLANT
SOLN STERILE WATER 1000 ML (IV SOLUTION) ×1 IMPLANT
SOLN STERILE WATER BTL 1000 ML (IV SOLUTION) ×1 IMPLANT
SPECIMEN JAR SMALL (MISCELLANEOUS) ×1 IMPLANT
STRIP CLOSURE SKIN 1/2X4 (GAUZE/BANDAGES/DRESSINGS) ×1 IMPLANT
SUT MNCRL AB 4-0 PS2 18 (SUTURE) ×1 IMPLANT
SUT VICRYL 0 UR6 27IN ABS (SUTURE) ×1 IMPLANT
TAPE STRIPS DRAPE STRL (GAUZE/BANDAGES/DRESSINGS) IMPLANT
TOWEL GREEN STERILE (TOWEL DISPOSABLE) ×1 IMPLANT
TOWEL GREEN STERILE FF (TOWEL DISPOSABLE) ×1 IMPLANT
TRAY LAPAROSCOPIC MC (CUSTOM PROCEDURE TRAY) ×1 IMPLANT
TROCAR BALLN 12MMX100 BLUNT (TROCAR) ×1 IMPLANT
TROCAR Z-THREAD OPTICAL 5X100M (TROCAR) ×1 IMPLANT
WARMER LAPAROSCOPE (MISCELLANEOUS) ×1 IMPLANT

## 2023-11-08 NOTE — Anesthesia Postprocedure Evaluation (Signed)
 Anesthesia Post Note  Patient: Kari Roberts  Procedure(s) Performed: LAPAROSCOPIC CHOLECYSTECTOMY     Patient location during evaluation: PACU Anesthesia Type: General Level of consciousness: awake and alert Pain management: pain level controlled Vital Signs Assessment: post-procedure vital signs reviewed and stable Respiratory status: spontaneous breathing, nonlabored ventilation, respiratory function stable and patient connected to nasal cannula oxygen Cardiovascular status: blood pressure returned to baseline and stable Postop Assessment: no apparent nausea or vomiting Anesthetic complications: no   No notable events documented.  Last Vitals:  Vitals:   11/08/23 0915 11/08/23 0925  BP: 110/73 129/63  Pulse: 80 81  Resp: 18 13  Temp:  36.5 C  SpO2: 97% 98%    Last Pain:  Vitals:   11/08/23 0915  TempSrc:   PainSc: Asleep                 Rome Ade

## 2023-11-08 NOTE — Op Note (Signed)
 Preoperative diagnosis: symptomatic cholelithiasis Postoperative diagnosis saa,  chronic cholecystitis Procedure: Laparoscopic cholecystectomy Surgeon: Dr. Adina Bury Anesthesia: General  Complications: None Drains: None Estimated blood loss: Minimal Specimens: Gallbladder and contents to pathology Sponge count was correct at completion Disposition recovery stable  Indications: 58 year old female on no from treatment of right breast ductal carcinoma in situ. I just seen her in August. She for the past number of months has noticed right upper quadrant pain. She does not sure if this is really related to eating. She does have intermittent pain that is related somewhat to movement. This is happening less frequent than it was in March or April but is still bothering her enough that it has been evaluated. Is not associated with nausea vomiting.  She has had an ultrasound that shows that she does not have any abnormality to her gallbladder. She did have a HIDA scan that showed a reduced ejection fraction of her gallbladder to 12% with normal being over 35%. She does state that this made her feel somewhat abnormal but did not really reproduce the pain to the most severe degree. We discussed options and elected to proceed with lap chole.       Procedure: After informed consent was obtained she was taken to the operating room.  She was given antibiotics.  SCDs were placed.  She was placed under anesthesia without complication.  She was prepped and draped in a standard sterile surgical fashion.  A surgical timeout was then performed.   I made a vertical incision below the umbilicus. I incised the fascia and entered the peritoneum bluntly.  I placed a 0 vicryl pursestring suture and inserted a Hasson trocar.     The abdomen was insufflated to 15 mmHg pressure.   I then placed 3 additional 5 mm trocars in the epigastrium and right side of the abdomen under direct vision.  The gallbladder was then retracted  cephalad and lateral. She did have evidence of chronic cholecystitis.  I was then able to dissect the triangle and clearly obtain a critical view of safety.  I took the gallbladder off the liver bed for some distance just to confirm this.  The green dye was visible in the common duct and the cystic duct confirming my anatomy.I then divided the artery after placing clips leaving two in place. I then placed clips across the duct. I divided the duct leaving two in place. The clips completely traversed the duct and the duct was viable. The gallbladder was then placed in a retrieval bag.  I obtained hemostasis. I then removed the gallbladder in the retrieval bag.  The umbilical  trocar was removed.I tied the pursestring down first.  I placed 2 2-0 Vicryl sutures to completely obliterate the umbilical defect.  I then removed the remaining trocars and desufflated the abdomen.  These were closed with 4-0 Monocryl and glue.  She tolerated this well was extubated and transferred recovery stable

## 2023-11-08 NOTE — Discharge Instructions (Signed)
 [facility] LAPAROSCOPIC SURGERY: POST OP INSTRUCTIONS  Always review your discharge instruction sheet given to you by the facility where your surgery was performed. IF YOU HAVE DISABILITY OR FAMILY LEAVE FORMS, YOU MUST BRING THEM TO THE OFFICE FOR PROCESSING.   DO NOT GIVE THEM TO YOUR DOCTOR.  A prescription for pain medication may be given to you upon discharge.  Take your pain medication as prescribed, if needed.  If narcotic pain medicine is not needed, then you may take acetaminophen  (Tylenol ), naprosyn (Alleve), or ibuprofen  (Advil ) as needed. Take your usually prescribed medications unless otherwise directed. If you need a refill on your pain medication, please contact your pharmacy.  They will contact our office to request authorization. Prescriptions will not be filled after 5pm or on week-ends. You should follow a light diet the first few days after arrival home, such as soup and crackers, etc.  Be sure to include lots of fluids daily. Most patients will experience some swelling and bruising in the area of the incisions.  Ice packs will help.  Swelling and bruising can take several days to resolve.  It is common to experience some constipation if taking pain medication after surgery.  Increasing fluid intake and taking a stool softener (such as Colace) will usually help or prevent this problem from occurring.  A mild laxative (Milk of Magnesia or Miralax) should be taken according to package instructions if there are no bowel movements after 48 hours. Unless discharge instructions indicate otherwise, you may remove your bandages 48 hours after surgery, and you may shower at that time.  You may have steri-strips (small skin tapes) in place directly over the incision.  These strips should be left on the skin for 7-10 days.  If your surgeon used skin glue on the incision, you may shower in 24 hours.  The glue will flake off over the next  2-3 weeks.  Any sutures or staples will be removed at the office during your follow-up visit. ACTIVITIES:  You may resume regular (light) daily activities beginning the next day--such as daily self-care, walking, climbing stairs--gradually increasing activities as tolerated.  You may have sexual intercourse when it is comfortable.  Refrain from any heavy lifting or straining until approved by your doctor. You may drive when you are no longer taking prescription pain medication, you can comfortably wear a seatbelt, and you can safely maneuver your car and apply brakes. RETURN TO WORK:  __________________________________________________________ Kari Roberts should see your doctor in the office for a follow-up appointment approximately 2-3 weeks after your surgery.  Make sure that you call for this appointment within a day or two after you arrive home to insure a convenient appointment time. OTHER INSTRUCTIONS: __________________________________________________________________________________________________________________________ __________________________________________________________________________________________________________________________ WHEN TO CALL YOUR DOCTOR: Fever over 101.0 Inability to urinate Continued bleeding from incision. Increased pain, redness, or drainage from the incision. Increasing abdominal pain  The clinic staff is available to answer your questions during regular business hours.  Please don't hesitate to call and ask to speak to one of the nurses for clinical concerns.  If you have a medical emergency, go to the nearest emergency room or call 911.  A surgeon from [facility] is always on call at the hospital. [address], [address], [place], [place]  [address] ? [address] [address], [place], [place]   [address] [phone] ? [phone] ? FAX [phone] Web site: [email or web address]

## 2023-11-08 NOTE — Anesthesia Preprocedure Evaluation (Signed)
 Anesthesia Evaluation  Patient identified by MRN, date of birth, ID band Patient awake    Reviewed: Allergy & Precautions, NPO status , Patient's Chart, lab work & pertinent test results  History of Anesthesia Complications Negative for: history of anesthetic complications  Airway Mallampati: II  TM Distance: >3 FB Neck ROM: Full    Dental no notable dental hx. (+) Teeth Intact   Pulmonary neg pulmonary ROS, neg sleep apnea, neg COPD, Patient abstained from smoking.Not current smoker   Pulmonary exam normal breath sounds clear to auscultation       Cardiovascular Exercise Tolerance: Good METS(-) hypertension(-) CAD and (-) Past MI negative cardio ROS (-) dysrhythmias  Rhythm:Regular Rate:Normal - Systolic murmurs    Neuro/Psych negative neurological ROS  negative psych ROS   GI/Hepatic ,neg GERD  ,,(+)     (-) substance abuse    Endo/Other  neg diabetes    Renal/GU negative Renal ROS     Musculoskeletal   Abdominal   Peds  Hematology   Anesthesia Other Findings Past Medical History: No date: Cancer [facility])     Comment:  recent breast CA diagnosis No date: Family history of breast cancer No date: Personal history of radiation therapy  Reproductive/Obstetrics                              Anesthesia Physical Anesthesia Plan  ASA: 1  Anesthesia Plan: General   Post-op Pain Management: Tylenol  PO (pre-op)* and Toradol  IV (intra-op)*   Induction: Intravenous  PONV Risk Score and Plan: 4 or greater and Ondansetron , Dexamethasone  and Midazolam   Airway Management Planned: Oral ETT  Additional Equipment: None  Intra-op Plan:   Post-operative Plan: Extubation in OR  Informed Consent: I have reviewed the patients History and Physical, chart, labs and discussed the procedure including the risks, benefits and alternatives for the proposed anesthesia with the patient or authorized  representative who has indicated his/her understanding and acceptance.     Dental advisory given  Plan Discussed with: CRNA and Surgeon  Anesthesia Plan Comments: (Discussed risks of anesthesia with patient, including PONV, sore throat, lip/dental/eye damage. Rare risks discussed as well, such as cardiorespiratory and neurological sequelae, and allergic reactions. Discussed the role of CRNA in patient's perioperative care. Patient understands.)        Anesthesia Quick Evaluation

## 2023-11-08 NOTE — H&P (Signed)
 58 year old female on no from treatment of right breast ductal carcinoma in situ. I just seen her in August. She for the past number of months has noticed right upper quadrant pain. She does not sure if this is really related to eating. She does have intermittent pain that is related somewhat to movement. This is happening less frequent than it was in March or April but is still bothering her enough that it has been evaluated. Is not associated with nausea vomiting. She does have some issues with constipation and changes in her bowel movements. She has had an ultrasound that shows that she does not have any abnormality to her gallbladder. She did have a HIDA scan that showed a reduced ejection fraction of her gallbladder to 12% with normal being over 35%. She does state that this made her feel somewhat abnormal but did not really reproduce the pain to the most severe degree. She comes in today to discuss her options. She does have a colonoscopy in 2020 which was negative she tells me.  Review of Systems: A complete review of systems was obtained from the patient. I have reviewed this information and discussed as appropriate with the patient. See HPI as well for other ROS.  Review of Systems  Gastrointestinal: Positive for abdominal pain.  All other systems reviewed and are negative.  Medical History: Past Medical History:  Diagnosis Date  History of cancer   Patient Active Problem List  Diagnosis  Ductal carcinoma in situ (DCIS) of right breast   Past Surgical History:  Procedure Laterality Date  MASTECTOMY PARTIAL / LUMPECTOMY   Allergies  Allergen Reactions  Amoxicillin-Pot Clavulanate Hives   Current Outpatient Medications on File Prior to Visit  Medication Sig Dispense Refill  fenofibrate  nanocrystallized (TRICOR ) 145 MG tablet Take 145 mg by mouth once daily  tamoxifen  (NOLVADEX ) 20 MG tablet tamoxifen  20 mg tablet TAKE 1 TABLET BY MOUTH EVERY DAY   History reviewed. No  pertinent family history.   Social History   Tobacco Use  Smoking Status Never  Smokeless Tobacco Never  Marital status: Unknown  Tobacco Use  Smoking status: Never  Smokeless tobacco: Never  Substance and Sexual Activity  Alcohol use: Never  Drug use: Never  Sexual activity: Defer   Objective:   Physical Exam Constitutional:  Appearance: Normal appearance.  Abdominal:  General: There is no distension.  Palpations: Abdomen is soft.  Tenderness: There is no abdominal tenderness.  Hernia: No hernia is present.  Neurological:  Mental Status: She is alert.     Assessment and Plan:   Biliary dyskinesia  Laparoscopic cholecystectomy  We discussed today that I am not entirely sure that her symptoms are related to her gallbladder. Really the next test would be to do a cholecystectomy. I do not really have another good reason. It does not really appear to be musculoskeletal at least while I examine her. Due to the fact that she had an abnormal HIDA scan with some symptoms related to that I think it is not unreasonable to remove her gallbladder. I did discuss that there is a chance that this may not make her better. I discussed the procedure in detail. We discussed the risks and benefits of a laparoscopic cholecystectomy and possible cholangiogram including, but not limited to bleeding, infection, injury to surrounding structures such as the intestine or liver, bile leak, retained gallstones, need to convert to an open procedure, prolonged diarrhea, blood clots such as DVT, common bile duct injury, anesthesia risks, and possible  need for additional procedures. We also discussed small risk of subtotal cholecystectomy. The likelihood of improvement in symptoms and return to the patient's normal status is good. We discussed the typical post-operative recovery course.

## 2023-11-08 NOTE — Interval H&P Note (Signed)
 History and Physical Interval Note:  11/08/2023 7:36 AM  Kari Roberts  has presented today for surgery, with the diagnosis of BILIARY DYSKINESIA.  The various methods of treatment have been discussed with the patient and family. After consideration of risks, benefits and other options for treatment, the patient has consented to  Procedure(s): LAPAROSCOPIC CHOLECYSTECTOMY (N/A) as a surgical intervention.  The patient's history has been reviewed, patient examined, no change in status, stable for surgery.  I have reviewed the patient's chart and labs.  Questions were answered to the patient's satisfaction.     Donnice Bury

## 2023-11-08 NOTE — Transfer of Care (Signed)
 Immediate Anesthesia Transfer of Care Note  Patient: Kari Roberts  Procedure(s) Performed: LAPAROSCOPIC CHOLECYSTECTOMY  Patient Location: PACU  Anesthesia Type:General  Level of Consciousness: drowsy  Airway & Oxygen Therapy: Patient Spontanous Breathing and Patient connected to nasal cannula oxygen  Post-op Assessment: Report given to RN and Post -op Vital signs reviewed and stable  Post vital signs: Reviewed and stable  Last Vitals:  Vitals Value Taken Time  BP 132/64 11/08/23 08:55  Temp    Pulse 73 11/08/23 08:58  Resp 13 11/08/23 08:58  SpO2 100 % 11/08/23 08:58  Vitals shown include unfiled device data.  Last Pain:  Vitals:   11/08/23 0618  TempSrc:   PainSc: 0-No pain         Complications: No notable events documented.

## 2023-11-08 NOTE — Anesthesia Procedure Notes (Signed)
 Procedure Name: Intubation Date/Time: 11/08/2023 8:08 AM  Performed by: Sherlyn Lapine, CRNAPre-anesthesia Checklist: Patient identified, Emergency Drugs available, Suction available and Patient being monitored Patient Re-evaluated:Patient Re-evaluated prior to induction Oxygen Delivery Method: Circle System Utilized Preoxygenation: Pre-oxygenation with 100% oxygen Induction Type: IV induction Ventilation: Mask ventilation without difficulty Laryngoscope Size: Miller and 2 Grade View: Grade I Tube type: Oral Tube size: 7.0 mm Number of attempts: 1 Airway Equipment and Method: Stylet and Oral airway Placement Confirmation: ETT inserted through vocal cords under direct vision, positive ETCO2 and breath sounds checked- equal and bilateral Secured at: 22 cm Tube secured with: Tape Dental Injury: Teeth and Oropharynx as per pre-operative assessment

## 2023-11-09 ENCOUNTER — Encounter (HOSPITAL_COMMUNITY): Payer: Self-pay | Admitting: General Surgery

## 2023-11-09 LAB — SURGICAL PATHOLOGY

## 2024-01-06 ENCOUNTER — Other Ambulatory Visit: Payer: Self-pay | Admitting: Nurse Practitioner

## 2024-01-06 DIAGNOSIS — Z1231 Encounter for screening mammogram for malignant neoplasm of breast: Secondary | ICD-10-CM

## 2024-02-17 ENCOUNTER — Ambulatory Visit
Admission: RE | Admit: 2024-02-17 | Discharge: 2024-02-17 | Disposition: A | Payer: Self-pay | Source: Ambulatory Visit | Attending: Nurse Practitioner | Admitting: Nurse Practitioner

## 2024-02-17 DIAGNOSIS — Z1231 Encounter for screening mammogram for malignant neoplasm of breast: Secondary | ICD-10-CM

## 2024-10-19 ENCOUNTER — Ambulatory Visit: Admitting: Hematology and Oncology
